# Patient Record
Sex: Female | Born: 1960 | Race: Black or African American | Hispanic: No | Smoking: Never smoker
Health system: Southern US, Community
[De-identification: ages and names within clinical notes are randomized; demographics above are authoritative.]

## PROBLEM LIST (undated history)

## (undated) ENCOUNTER — Ambulatory Visit (HOSPITAL_COMMUNITY): Admission: EM | Payer: 59 | Source: Home / Self Care

## (undated) ENCOUNTER — Ambulatory Visit (HOSPITAL_COMMUNITY): Admission: EM | Payer: 59

## (undated) DIAGNOSIS — T7840XA Allergy, unspecified, initial encounter: Secondary | ICD-10-CM

## (undated) DIAGNOSIS — I10 Essential (primary) hypertension: Secondary | ICD-10-CM

## (undated) DIAGNOSIS — E119 Type 2 diabetes mellitus without complications: Secondary | ICD-10-CM

## (undated) HISTORY — PX: CHOLECYSTECTOMY: SHX55

## (undated) HISTORY — DX: Allergy, unspecified, initial encounter: T78.40XA

## (undated) HISTORY — DX: Type 2 diabetes mellitus without complications: E11.9

## (undated) HISTORY — DX: Essential (primary) hypertension: I10

## (undated) HISTORY — PX: DENTAL SURGERY: SHX609

---

## 2001-05-21 ENCOUNTER — Encounter: Payer: Self-pay | Admitting: Emergency Medicine

## 2001-05-21 ENCOUNTER — Emergency Department (HOSPITAL_COMMUNITY): Admission: EM | Admit: 2001-05-21 | Discharge: 2001-05-21 | Payer: Self-pay | Admitting: Emergency Medicine

## 2001-09-08 ENCOUNTER — Emergency Department (HOSPITAL_COMMUNITY): Admission: EM | Admit: 2001-09-08 | Discharge: 2001-09-08 | Payer: Self-pay | Admitting: Emergency Medicine

## 2001-09-08 ENCOUNTER — Inpatient Hospital Stay (HOSPITAL_COMMUNITY): Admission: EM | Admit: 2001-09-08 | Discharge: 2001-09-09 | Payer: Self-pay | Admitting: *Deleted

## 2001-09-08 ENCOUNTER — Encounter: Payer: Self-pay | Admitting: Emergency Medicine

## 2001-10-09 ENCOUNTER — Ambulatory Visit (HOSPITAL_COMMUNITY): Admission: RE | Admit: 2001-10-09 | Discharge: 2001-10-09 | Payer: Self-pay | Admitting: Pediatrics

## 2002-09-20 ENCOUNTER — Other Ambulatory Visit: Admission: RE | Admit: 2002-09-20 | Discharge: 2002-09-20 | Payer: Self-pay | Admitting: Family Medicine

## 2002-10-12 ENCOUNTER — Ambulatory Visit (HOSPITAL_COMMUNITY): Admission: RE | Admit: 2002-10-12 | Discharge: 2002-10-12 | Payer: Self-pay | Admitting: Family Medicine

## 2002-10-12 ENCOUNTER — Encounter: Payer: Self-pay | Admitting: Family Medicine

## 2003-07-05 ENCOUNTER — Ambulatory Visit (HOSPITAL_BASED_OUTPATIENT_CLINIC_OR_DEPARTMENT_OTHER): Admission: RE | Admit: 2003-07-05 | Discharge: 2003-07-05 | Payer: Self-pay | Admitting: Family Medicine

## 2004-05-13 ENCOUNTER — Encounter: Admission: RE | Admit: 2004-05-13 | Discharge: 2004-05-13 | Payer: Self-pay | Admitting: Family Medicine

## 2005-09-07 ENCOUNTER — Other Ambulatory Visit: Admission: RE | Admit: 2005-09-07 | Discharge: 2005-09-07 | Payer: Self-pay | Admitting: Family Medicine

## 2005-10-24 ENCOUNTER — Emergency Department (HOSPITAL_COMMUNITY): Admission: EM | Admit: 2005-10-24 | Discharge: 2005-10-25 | Payer: Self-pay | Admitting: Emergency Medicine

## 2006-01-01 ENCOUNTER — Emergency Department (HOSPITAL_COMMUNITY): Admission: EM | Admit: 2006-01-01 | Discharge: 2006-01-01 | Payer: Self-pay | Admitting: Emergency Medicine

## 2006-09-04 ENCOUNTER — Emergency Department (HOSPITAL_COMMUNITY): Admission: EM | Admit: 2006-09-04 | Discharge: 2006-09-04 | Payer: Self-pay | Admitting: Emergency Medicine

## 2008-09-23 ENCOUNTER — Encounter: Payer: Self-pay | Admitting: Family Medicine

## 2008-09-23 ENCOUNTER — Ambulatory Visit (HOSPITAL_COMMUNITY): Admission: RE | Admit: 2008-09-23 | Discharge: 2008-09-23 | Payer: Self-pay | Admitting: Family Medicine

## 2008-10-09 ENCOUNTER — Other Ambulatory Visit: Admission: RE | Admit: 2008-10-09 | Discharge: 2008-10-09 | Payer: Self-pay | Admitting: Family Medicine

## 2009-04-04 ENCOUNTER — Emergency Department (HOSPITAL_COMMUNITY): Admission: EM | Admit: 2009-04-04 | Discharge: 2009-04-04 | Payer: Self-pay | Admitting: Family Medicine

## 2009-04-15 ENCOUNTER — Encounter (INDEPENDENT_AMBULATORY_CARE_PROVIDER_SITE_OTHER): Payer: Self-pay | Admitting: Internal Medicine

## 2009-04-15 ENCOUNTER — Ambulatory Visit: Payer: Self-pay | Admitting: Vascular Surgery

## 2009-12-11 ENCOUNTER — Inpatient Hospital Stay (HOSPITAL_COMMUNITY): Admission: EM | Admit: 2009-12-11 | Discharge: 2009-04-17 | Payer: Self-pay | Admitting: Emergency Medicine

## 2010-03-25 LAB — POCT CARDIAC MARKERS
CKMB, poc: 3.6 ng/mL (ref 1.0–8.0)
Myoglobin, poc: 373 ng/mL (ref 12–200)
Troponin i, poc: <0.05 ng/mL (ref 0.00–0.09)
Troponin i, poc: <0.05 ng/mL (ref 0.00–0.09)

## 2010-03-25 LAB — URINALYSIS, ROUTINE W REFLEX MICROSCOPIC
Bilirubin Urine: NEGATIVE
Hgb urine dipstick: NEGATIVE
Nitrite: NEGATIVE
Protein, ur: NEGATIVE mg/dL
Specific Gravity, Urine: 1.014 (ref 1.005–1.030)
Urobilinogen, UA: 1 mg/dL (ref 0.0–1.0)

## 2010-03-25 LAB — CBC
Hemoglobin: 11.4 g/dL — ABNORMAL LOW (ref 12.0–15.0)
Hemoglobin: 11.6 g/dL — ABNORMAL LOW (ref 12.0–15.0)
Hemoglobin: 12.5 g/dL (ref 12.0–15.0)
MCHC: 32.8 g/dL (ref 30.0–36.0)
MCV: 83 fL (ref 78.0–100.0)
MCV: 83.4 fL (ref 78.0–100.0)
MCV: 83.5 fL (ref 78.0–100.0)
Platelets: 213 10*3/uL (ref 150–400)
RBC: 4.6 MIL/uL (ref 3.87–5.11)
RDW: 13.1 % (ref 11.5–15.5)
RDW: 13.4 % (ref 11.5–15.5)
WBC: 9.6 10*3/uL (ref 4.0–10.5)

## 2010-03-25 LAB — DIFFERENTIAL
Basophils Absolute: 0 10*3/uL (ref 0.0–0.1)
Basophils Relative: 0 % (ref 0–1)
Basophils Relative: 0 % (ref 0–1)
Eosinophils Absolute: 0.2 10*3/uL (ref 0.0–0.7)
Eosinophils Relative: 1 % (ref 0–5)
Eosinophils Relative: 2 % (ref 0–5)
Lymphocytes Relative: 20 % (ref 12–46)
Monocytes Relative: 5 % (ref 3–12)
Neutro Abs: 4.5 10*3/uL (ref 1.7–7.7)
Neutrophils Relative %: 62 % (ref 43–77)

## 2010-03-25 LAB — TROPONIN I: Troponin I: 0.01 ng/mL (ref 0.00–0.06)

## 2010-03-25 LAB — COMPREHENSIVE METABOLIC PANEL
ALT: 48 U/L — ABNORMAL HIGH (ref 0–35)
AST: 41 U/L — ABNORMAL HIGH (ref 0–37)
Albumin: 3.7 g/dL (ref 3.5–5.2)
Alkaline Phosphatase: 100 U/L (ref 39–117)
Alkaline Phosphatase: 85 U/L (ref 39–117)
BUN: 11 mg/dL (ref 6–23)
CO2: 28 mEq/L (ref 19–32)
Calcium: 8.8 mg/dL (ref 8.4–10.5)
Chloride: 106 mEq/L (ref 96–112)
GFR calc Af Amer: >60 mL/min (ref >60)
GFR calc non Af Amer: >60 mL/min (ref >60)
Glucose, Bld: 168 mg/dL — ABNORMAL HIGH (ref 70–99)
Potassium: 4.3 mEq/L (ref 3.5–5.1)
Total Bilirubin: 0.6 mg/dL (ref 0.3–1.2)
Total Protein: 7.6 g/dL (ref 6.0–8.3)

## 2010-03-25 LAB — CARDIAC PANEL(CRET KIN+CKTOT+MB+TROPI)
CK, MB: 3.7 ng/mL (ref 0.3–4.0)
CK, MB: 4.3 ng/mL — ABNORMAL HIGH (ref 0.3–4.0)
Relative Index: 0.7 (ref 0.0–2.5)
Total CK: 569 U/L — ABNORMAL HIGH (ref 7–177)
Total CK: 612 U/L — ABNORMAL HIGH (ref 7–177)
Troponin I: 0.02 ng/mL (ref 0.00–0.06)
Troponin I: <0.01 ng/mL (ref 0.00–0.06)

## 2010-03-25 LAB — BASIC METABOLIC PANEL
CO2: 26 mEq/L (ref 19–32)
Calcium: 8.6 mg/dL (ref 8.4–10.5)
Creatinine, Ser: 0.82 mg/dL (ref 0.4–1.2)
Glucose, Bld: 92 mg/dL (ref 70–99)
Sodium: 140 mEq/L (ref 135–145)

## 2010-03-25 LAB — CK TOTAL AND CKMB (NOT AT ARMC)
CK, MB: 4.7 ng/mL — ABNORMAL HIGH (ref 0.3–4.0)
Relative Index: 0.7 (ref 0.0–2.5)

## 2010-03-25 LAB — ETHANOL: Alcohol, Ethyl (B): <5 mg/dL (ref 0–10)

## 2010-03-25 LAB — LIPID PANEL
Cholesterol: 131 mg/dL (ref 0–200)
HDL: 37 mg/dL — ABNORMAL LOW (ref >39)
LDL Cholesterol: 81 mg/dL (ref 0–99)

## 2010-03-25 LAB — PREGNANCY, URINE: Preg Test, Ur: NEGATIVE

## 2010-03-25 LAB — PROTIME-INR: Prothrombin Time: 13.4 seconds (ref 11.6–15.2)

## 2010-05-22 NOTE — Discharge Summary (Signed)
   NAME:  Colleen Rowe, Colleen Rowe                       ACCOUNT NO.:  [id number]   MEDICAL RECORD NO.:  [id number]                   PATIENT TYPE:  INP   LOCATION:  2012                                 FACILITY:  [facility]   PHYSICIAN:  Sherral Hammers, M.D.               DATE OF BIRTH:  [birth year]   DATE OF ADMISSION:  09/08/2001  DATE OF DISCHARGE:  09/09/2001                                 DISCHARGE SUMMARY   DISCHARGE DIAGNOSES:  1. Chest pain, noncardiac in origin.  2. Elevated liver function tests of undetermined etiology.   HOSPITAL COURSE:  The patient is a 50 year old African-American female who  presented to [facility] with chest pain consistent with  angina.  Enzymes were obtained. Her enzymes were abnormal, but she was  transferred to [facility] for further evaluation.  CKs at [facility] were 899 with an MB of 6.3 and troponin of 0.01.  The  patient was seen by Dr. Domingo Sep.  She was continued on heparin.  She ruled  out for an MI.  Her CKs remained elevated, but her troponin was negative.  She was discharged on September 09, 2001, and will follow up with office with  Dr. Domingo Sep early next week.   DISCHARGE MEDICATIONS:  Over-the-counter Zantac p.r.n.   DISCHARGE LABORATORY DATA:  White count 8.6, hemoglobin 11.0, hematocrit  33.1, platelets 232, INR 1.0.  Sodium 136, potassium 3.6, BUN 11, creatinine  1.0.  AST is 46, ALT 61.  CK 766, troponin 0.02.  Lipid profile shows an LDL  of 46, HDL of 43, triglycerides 67.  TSH 1.079.  Urinalysis does show a  moderate amount of myoglobin.  Urine culture was unremarkable.   DISPOSITION:  The patient was discharged in stable condition and will follow  up in the office with Dr. Domingo Sep next week.     Abelino Derrick, P.A.                      Sherral Hammers, M.D.    Lenard Lance  D:  10/06/2001  T:  10/10/2001  Job:  [id number]

## 2010-05-22 NOTE — Cardiovascular Report (Signed)
NAME:  Colleen Rowe, Colleen Rowe                       ACCOUNT NO.:  [id number]   MEDICAL RECORD NO.:  [id number]                   PATIENT TYPE:  OIB   LOCATION:  2857                                 FACILITY:  [facility]   PHYSICIAN:  Cristy Hilts. Jacinto Halim, M.D.                  DATE OF BIRTH:  July 12, 1960   DATE OF PROCEDURE:  10/09/2001  DATE OF DISCHARGE:  10/09/2001                              CARDIAC CATHETERIZATION   PROCEDURES PERFORMED:  1. Left ventriculography.  2. Selective right and left coronary arteriography.  3. Aortic root injection.  4. Selective bilateral renal arteriography.  5. Right femoral angiography with closure of right femoral artery access     with Perclose.   INDICATION:  The patient is a 50 year old female with a history of chest  pain, hypertension who had undergone nuclear stress test, which revealed  anterior wall thinning probably secondary to breast attenuation, with mildly  depressed EF of 46% of recent. However, she continued to have recurrent  chest pain.  Given this, she was brought to cardiac catheterization  laboratory to evaluate her coronary anatomy.  Aortic root injection was  performed because of normal coronaries to evaluate for evidence of aortic  insufficiency with hypertension given her chest pain and coronary ischemia.  Bilateral selective renal arteriography was performed to evaluate for  renovascular hypertension as her blood pressure was 211/109 mmHg.   HEMODYNAMIC DATA:  The left ventricular pressures 149/10 with an end-  diastolic pressure of 18 mmHg. The aortic pressure were 146/82 with a mean  of 110 mmHg.  There was no pressure gradient across the aortic valve.   ANGIOGRAPHIC DATA:   LEFT VENTRICULOGRAM:  The left ventricular systolic function was normal. EF  was estimated at 60%.  There was no significant mitral regurgitation.   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  There is no evidence of aortic  regurgitation nor evidence of aortic  dissection.   Right coronary artery:  The right coronary artery is a large caliber vessel  and a dominant vessel. It is normal.   Left main coronary artery:  The left main coronary artery is a large caliber  vessel and is normal.   Left circumflex:  Left circumflex is a large caliber vessel.  It gives  origin to a small obtuse marginal #1.  It is normal.   Left anterior descending artery:  Left anterior descending artery is a large  caliber vessel.  It wraps around the apex.  It is normal. It gives origin to  a very large anterolateral one branch (diagonal #1).  This is normal.   SELECTIVE RENAL ARTERIOGRAPHY:  Selective renal arteriography revealed  presence of single renal arteries on either side. There is one to the left  and one to the right.  They were normal.    IMPRESSION:  1. Normal coronary arteries with normal left ventricular systolic  function,     mildly elevated left ventricle end-diastolic pressure, probably secondary     to hypertension with hypertensive heart disease.  2. Normal renal arteries, normal aortic root injection. No evidence of     aortic regurgitation.  3. Chest pain, probably of noncardiac etiology.   RECOMMENDATIONS:  Evaluation for noncardiac cause of chest pain is  indicated.   TECHNIQUE OF PROCEDURE:  Under the usual sterile precautions, using a 6  French right femoral artery access and a 6 [place] multipurpose B2 catheter  was advanced into the ascending aorta over a 0.035 inch J wire. The catheter  was gently advanced in the left ventricle and left ventricular pressures  were monitored.  Hand contrast injection of the left ventricle was  performed, both in LAO and RAO projection. Then the catheter was flushed  with saline, pulled back into the ascending aorta and pressure gradient  across gradient across the aortic valve was monitored. The right coronary  artery was selectively engaged and angiography was  performed.  In a similar  fashion the left main coronary artery was selectively engaged and  angiography was performed.  Then the catheter was pulled back into the  aortic root and aortic root injection was performed in the LAO projection.  The catheter was pulled back into the abdominal aorta and selective right  and left renal arteriography was performed. Then the catheter was pulled out  of the body.  Right femoral angiography was performed through the arterial  access sheath and access was closed with Perclose with excellent hemostasis  obtained.  The patient was transferred to recovery in a stable condition.                                                 Cristy Hilts. Jacinto Halim, M.D.    Pilar Plate  D:  10/09/2001  T:  10/11/2001  Job:  [id number]   cc:   Dani Gobble, MD   Bernerd Limbo. Leona Carry, M.D.

## 2013-05-03 ENCOUNTER — Encounter (HOSPITAL_COMMUNITY): Payer: Self-pay | Admitting: Emergency Medicine

## 2013-05-03 ENCOUNTER — Emergency Department (INDEPENDENT_AMBULATORY_CARE_PROVIDER_SITE_OTHER)
Admission: EM | Admit: 2013-05-03 | Discharge: 2013-05-03 | Disposition: A | Discharge status: Home / Self Care | Payer: PRIVATE HEALTH INSURANCE | Source: Home / Self Care | Attending: Emergency Medicine | Admitting: Emergency Medicine

## 2013-05-03 ENCOUNTER — Emergency Department (INDEPENDENT_AMBULATORY_CARE_PROVIDER_SITE_OTHER): Payer: PRIVATE HEALTH INSURANCE

## 2013-05-03 DIAGNOSIS — J209 Acute bronchitis, unspecified: Secondary | ICD-10-CM

## 2013-05-03 DIAGNOSIS — J019 Acute sinusitis, unspecified: Secondary | ICD-10-CM

## 2013-05-03 LAB — POCT RAPID STREP A: STREPTOCOCCUS, GROUP A SCREEN (DIRECT): NEGATIVE

## 2013-05-03 MED ORDER — ALBUTEROL SULFATE HFA 108 (90 BASE) MCG/ACT IN AERS: 2.0000 | INHALATION_SPRAY | Freq: Four times a day (QID) | RESPIRATORY_TRACT | Status: DC

## 2013-05-03 MED ORDER — AZITHROMYCIN 250 MG PO TABS: ORAL_TABLET | ORAL | Status: DC

## 2013-05-03 MED ORDER — GUAIFENESIN-CODEINE 100-10 MG/5ML PO SYRP: 10.0000 mL | ORAL_SOLUTION | Freq: Four times a day (QID) | ORAL | Status: DC | PRN

## 2013-05-03 MED ORDER — PREDNISONE 20 MG PO TABS: 20.0000 mg | ORAL_TABLET | Freq: Two times a day (BID) | ORAL | Status: DC

## 2013-05-03 NOTE — ED Provider Notes (Signed)
Chief Complaint   Chief Complaint  Patient presents with  . URI    History of Present Illness   Colleen Rowe is a 53 year old female who has had a three-week history of headache, sore throat, cough productive of red sputum, chest tightness, wheezing, nasal congestion with clear drainage, headache, ear congestion, eye redness, sweats, nausea, and vomiting. She denies any chest pain, fever, diarrhea, or abdominal pain. She's had no sick exposures, but does work as a Electrical engineersecurity guard at [employer]. No history of asthma or cigarette smoking.  Review of Systems   Other than as noted above, the patient denies any of the following symptoms: Systemic:  No fevers, chills, sweats, or myalgias. Eye:  No redness or discharge. ENT:  No ear pain, headache, nasal congestion, drainage, sinus pressure, or sore throat. Neck:  No neck pain, stiffness, or swollen glands. Lungs:  No cough, sputum production, hemoptysis, wheezing, chest tightness, shortness of breath or chest pain. GI:  No abdominal pain, nausea, vomiting or diarrhea.  PMFSH   Past medical history, family history, social history, meds, and allergies were reviewed.   Physical exam   Vital signs:  BP 156/90  Pulse 88  Temp(Src) 98 F (36.7 C) (Oral)  Resp 16  SpO2 98% General:  Alert and oriented.  In no distress.  Skin warm and dry. Eye:  No conjunctival injection or drainage. Lids were normal. ENT:  TMs and canals were normal, without erythema or inflammation.  Nasal mucosa was clear and uncongested, without drainage.  Mucous membranes were moist.  Pharynx was clear with no exudate or drainage.  There were no oral ulcerations or lesions. Neck:  Supple, no adenopathy, tenderness or mass. Lungs:  No respiratory distress.  Lungs were clear to auscultation, without wheezes, rales or rhonchi.  Breath sounds were clear and equal bilaterally.  Heart:  Regular rhythm, without gallops, murmers or rubs. Skin:  Clear, warm, and dry,  without rash or lesions.  Labs   Results for orders placed during the hospital encounter of 05/03/13  POCT RAPID STREP A ([facility] CARE ONLY)      Result Value Ref Range   Streptococcus, Group A Screen (Direct) NEGATIVE  NEGATIVE     Radiology   Dg Chest 2 View  05/03/2013   CLINICAL DATA:  productive cough for 3 weeks,  hemoptysis  EXAM: CHEST  2 VIEW  COMPARISON:  DG CHEST 1V PORT dated 04/14/2009  FINDINGS: The heart size and mediastinal contours are within normal limits. Both lungs are clear. The visualized skeletal structures are unremarkable.  IMPRESSION: No active cardiopulmonary disease.   Electronically Signed   By: Salome HolmesHector  Cooper M.D.   On: 05/03/2013 19:42   Assessment     The primary encounter diagnosis was Acute bronchitis. A diagnosis of Acute sinusitis was also pertinent to this visit.  Plan    1.  Meds:  The following meds were prescribed:   New Prescriptions   ALBUTEROL (PROVENTIL HFA;VENTOLIN HFA) 108 (90 BASE) MCG/ACT INHALER    Inhale 2 puffs into the lungs 4 (four) times daily.   AZITHROMYCIN (ZITHROMAX Z-PAK) 250 MG TABLET    Take as directed.   GUAIFENESIN-CODEINE (GUIATUSS AC) 100-10 MG/5ML SYRUP    Take 10 mLs by mouth 4 (four) times daily as needed for cough.   PREDNISONE (DELTASONE) 20 MG TABLET    Take 1 tablet (20 mg total) by mouth 2 (two) times daily.    2.  Patient Education/Counseling:  The patient was  given appropriate handouts, self care instructions, and instructed in symptomatic relief.  Instructed to get extra fluids, rest, and use a cool mist vaporizer.    3.  Follow up:  The patient was told to follow up here if no better in 3 to 4 days, or sooner if becoming worse in any way, and given some red flag symptoms such as increasing fever, difficulty breathing, chest pain, or persistent vomiting which would prompt immediate return.  Follow up here as needed.      Reuben Likesavid C Morgen Linebaugh, MD 05/03/13 [phone]

## 2013-05-03 NOTE — Discharge Instructions (Signed)
How to Use an Inhaler °Proper inhaler technique is very important. Good technique ensures that the medicine reaches the lungs. Poor technique results in depositing the medicine on the tongue and back of the throat rather than in the airways. If you do not use the inhaler with good technique, the medicine will not help you. °STEPS TO FOLLOW IF USING AN INHALER WITHOUT AN EXTENSION TUBE °1. Remove the cap from the inhaler. °2. If you are using the inhaler for the first time, you will need to prime it. Shake the inhaler for 5 seconds and release four puffs into the air, away from your face. Ask your health care provider or pharmacist if you have questions about priming your inhaler. °3. Shake the inhaler for 5 seconds before each breath in (inhalation). °4. Position the inhaler so that the top of the canister faces up. °5. Put your index finger on the top of the medicine canister. Your thumb supports the bottom of the inhaler. °6. Open your mouth. °7. Either place the inhaler between your teeth and place your lips tightly around the mouthpiece, or hold the inhaler 1 2 inches away from your open mouth. If you are unsure of which technique to use, ask your health care provider. °8. Breathe out (exhale) normally and as completely as possible. °9. Press the canister down with your index finger to release the medicine. °10. At the same time as the canister is pressed, inhale deeply and slowly until your lungs are completely filled. This should take 4 6 seconds. Keep your tongue down. °11. Hold the medicine in your lungs for 5 10 seconds (10 seconds is best). This helps the medicine get into the small airways of your lungs. °12. Breathe out slowly, through pursed lips. Whistling is an example of pursed lips. °13. Wait at least 15 30 seconds between puffs. Continue with the above steps until you have taken the number of puffs your health care provider has ordered. Do not use the inhaler more than your health care provider  tells you. °14. Replace the cap on the inhaler. °15. Follow the directions from your health care provider or the inhaler insert for cleaning the inhaler. °STEPS TO FOLLOW IF USING AN INHALER WITH AN EXTENSION (SPACER) °1. Remove the cap from the inhaler. °2. If you are using the inhaler for the first time, you will need to prime it. Shake the inhaler for 5 seconds and release four puffs into the air, away from your face. Ask your health care provider or pharmacist if you have questions about priming your inhaler. °3. Shake the inhaler for 5 seconds before each breath in (inhalation). °4. Place the open end of the spacer onto the mouthpiece of the inhaler. °5. Position the inhaler so that the top of the canister faces up and the spacer mouthpiece faces you. °6. Put your index finger on the top of the medicine canister. Your thumb supports the bottom of the inhaler and the spacer. °7. Breathe out (exhale) normally and as completely as possible. °8. Immediately after exhaling, place the spacer between your teeth and into your mouth. Close your lips tightly around the spacer. °9. Press the canister down with your index finger to release the medicine. °10. At the same time as the canister is pressed, inhale deeply and slowly until your lungs are completely filled. This should take 4 6 seconds. Keep your tongue down and out of the way. °11. Hold the medicine in your lungs for 5 10 seconds (10   seconds is best). This helps the medicine get into the small airways of your lungs. Exhale. °12. Repeat inhaling deeply through the spacer mouthpiece. Again hold that breath for up to 10 seconds (10 seconds is best). Exhale slowly. If it is difficult to take this second deep breath through the spacer, breathe normally several times through the spacer. Remove the spacer from your mouth. °13. Wait at least 15 30 seconds between puffs. Continue with the above steps until you have taken the number of puffs your health care provider has  ordered. Do not use the inhaler more than your health care provider tells you. °14. Remove the spacer from the inhaler, and place the cap on the inhaler. °15. Follow the directions from your health care provider or the inhaler insert for cleaning the inhaler and spacer. °If you are using different kinds of inhalers, use your quick relief medicine to open the airways 10 15 minutes before using a steroid if instructed to do so by your health care provider. If you are unsure which inhalers to use and the order of using them, ask your health care provider, nurse, or respiratory therapist. °If you are using a steroid inhaler, always rinse your mouth with water after your last puff, then gargle and spit out the water. Do not swallow the water. °AVOID: °· Inhaling before or after starting the spray of medicine. It takes practice to coordinate your breathing with triggering the spray. °· Inhaling through the nose (rather than the mouth) when triggering the spray. °HOW TO DETERMINE IF YOUR INHALER IS FULL OR NEARLY EMPTY °You cannot know when an inhaler is empty by shaking it. A few inhalers are now being made with dose counters. Ask your health care provider for a prescription that has a dose counter if you feel you need that extra help. If your inhaler does not have a counter, ask your health care provider to help you determine the date you need to refill your inhaler. Write the refill date on a calendar or your inhaler canister. Refill your inhaler 7 10 days before it runs out. Be sure to keep an adequate supply of medicine. This includes making sure it is not expired, and that you have a spare inhaler.  °SEEK MEDICAL CARE IF:  °· Your symptoms are only partially relieved with your inhaler. °· You are having trouble using your inhaler. °· You have some increase in phlegm. °SEEK IMMEDIATE MEDICAL CARE IF:  °· You feel little or no relief with your inhalers. You are still wheezing and are feeling shortness of breath or  tightness in your chest or both. °· You have dizziness, headaches, or a fast heart rate. °· You have chills, fever, or night sweats. °· You have a noticeable increase in phlegm production, or there is blood in the phlegm. °MAKE SURE YOU:  °· Understand these instructions. °· Will watch your condition. °· Will get help right away if you are not doing well or get worse. °Document Released: 12/19/1999 Document Revised: 10/11/2012 Document Reviewed: 07/20/2012 °[employer]® Patient Information ©2014 [employer]. ° °Most upper respiratory infections are caused by viruses and do not require antibiotics.  We try to save the antibiotics for when we really need them to prevent bacteria from developing resistance to them.  Here are a few hints about things that can be done at home to help get over an upper respiratory infection quicker: ° °Get extra sleep and extra fluids.  Get 7 to 9 hours of sleep per   night and 6 to 8 glasses of water a day.  Getting extra sleep keeps the immune system from getting run down.  Most people with an upper respiratory infection are a little dehydrated.  The extra fluids also keep the secretions liquified and easier to deal with.  Also, get extra vitamin C.  4000 mg per day is the recommended dose. °For the aches, headache, and fever, acetaminophen or ibuprofen are helpful.  These can be alternated every 4 hours.  People with liver disease should avoid large amounts of acetaminophen, and people with ulcer disease, gastroesophageal reflux, gastritis, congestive heart failure, chronic kidney disease, coronary artery disease and the elderly should avoid ibuprofen. °For nasal congestion try Mucinex-D, or if you're having lots of sneezing or clear nasal drainage use Zyrtec-D. People with high blood pressure can take these if their blood pressure is controlled, if not, it's best to avoid the forms with a "D" (decongestants).  You can use the plain Mucinex, Allegra, Claritin, or Zyrtec even if your blood  pressure is not controlled.   °A Saline nasal spray such as Ocean Spray can also help.  You can add a decongestant sprays such as Afrin, but you should not use the decongestant sprays for more than 3 or 4 days since they can be habituating.  Breathe Rite nasal strips can also offer a non-drug alternative treatment to nasal congestion, especially at night. °For people with symptoms of sinusitis, sleeping with your head elevated can be helpful.  For sinus pain, moist, hot compresses to the face may provide some relief.  Many people find that inhaling steam as in a shower or from a pot of steaming water can help. °For any viral infection, zinc containing lozenges such as Cold-Eze or Zicam are helpful.  Zinc helps to fight viral infection.  Hot salt water gargles (8 oz of hot water, 1/2 tsp of table salt, and a pinch of baking soda) can give relief as well as hot beverages such as hot tea.  Sucrets extra strength lozenges will help the sore throat.  °For the cough, take Delsym 2 tsp every 12 hours.  It has also been found recently that Aleve can help control a cough.  The dose is 1 to 2 tablets twice daily with food.  This can be combined with Delsym. (Note, if you are taking ibuprofen, you should not take Aleve as well--take one or the other.) °A cool mist vaporizer will help keep your mucous membranes from drying out.  ° °It's important when you have an upper respiratory infection not to pass the infection to others.  This involves being very careful about the following: ° °Frequent hand washing or use of hand sanitizer, especially after coughing, sneezing, blowing your nose or touching your face, nose or eyes. °Do not shake hands or touch anyone and try to avoid touching surfaces that other people use such as doorknobs, shopping carts, telephones and computer keyboards. °Use tissues and dispose of them properly in a garbage can or ziplock bag. °Cough into your sleeve. °Do not let others eat or drink after  you. ° °It's also important to recognize the signs of serious illness and get evaluated if they occur: °Any respiratory infection that lasts more than 7 to 10 days.  Yellow nasal drainage and sputum are not reliable indicators of a bacterial infection, but if they last for more than 1 week, see your doctor. °Fever and sore throat can indicate strep. °Fever and cough can indicate influenza or pneumonia. °  Any kind of severe symptom such as difficulty breathing, intractable vomiting, or severe pain should prompt you to see a doctor as soon as possible. ° ° °Your body's immune system is really the thing that will get rid of this infection.  Your immune system is comprised of 2 types of specialized cells called T cells and B cells.  T cells coordinate the array of cells in your body that engulf invading bacteria or viruses while B cells orchestrate the production of antibodies that neutralize infection.  Anything we do or any medications we give you, will just strengthen your immune system or help it clear up the infection quicker.  Here are a few helpful hints to improve your immune system to help overcome this illness or to prevent future infections: °· A few vitamins can improve the health of your immune system.  That's why your diet should include plenty of fruits, vegetables, fish, nuts, and whole grains. °· Vitamin A and bet-carotene can increase the cells that fight infections (T cells and B cells).  Vitamin A is abundant in dark greens and orange vegetables such as spinach, greens, sweet potatoes, and carrots. °· Vitamin B6 contributes to the maturation of white blood cells, the cells that fight disease.  Foods with vitamin B6 include cold cereal and bananas. °· Vitamin C is credited with preventing colds because it increases white blood cells and also prevents cellular damage.  Citrus fruits, peaches and green and red bell peppers are all hight in vitamin C. °· Vitamin E is an anti-oxidant that encourages the  production of natural killer cells which reject foreign invaders and B cells that produce antibodies.  Foods high in vitamin E include wheat germ, nuts and seeds. °· Foods high in omega-3 fatty acids found in foods like salmon, tuna and mackerel boost your immune system and help cells to engulf and absorb germs. °· Probiotics are good bacteria that increase your T cells.  These can be found in yogurt and are available in supplements such as Culturelle or Align. °· Moderate exercise increases the strength of your immune system and your ability to recover from illness.  I suggest 3 to 5 moderate intensity 30 minute workouts per week.   °· Sleep is another component of maintaining a strong immune system.  It enables your body to recuperate from the day's activities, stress and work.  My recommendation is to get between 7 and 9 hours of sleep per night. °· If you smoke, try to quit completely or at least cut down.  Drink alcohol only in moderation if at all.  No more than 2 drinks daily for men or 1 for women. °· Get a flu vaccine early in the fall or if you have not gotten one yet, once this illness has run its course.  If you are over 65, a smoker, or an asthmatic, get a pneumococcal vaccine. °· My final recommendation is to maintain a healthy weight.  Excess weight can impair the immune system by interfering with the way the immune system deals with invading viruses or bacteria. ° ° ° °Bronchitis °Bronchitis is inflammation of the airways that extend from the windpipe into the lungs (bronchi). The inflammation often causes mucus to develop, which leads to a cough. If the inflammation becomes severe, it may cause shortness of breath. °CAUSES  °Bronchitis may be caused by:  °· Viral infections.   °· Bacteria.   °· Cigarette smoke.   °· Allergens, pollutants, and other irritants.   °SIGNS AND SYMPTOMS  °  The most common symptom of bronchitis is a frequent cough that produces mucus. Other symptoms include: °· Fever.    °· Body aches.   °· Chest congestion.   °· Chills.   °· Shortness of breath.   °· Sore throat.   °DIAGNOSIS  °Bronchitis is usually diagnosed through a medical history and physical exam. Tests, such as chest X-rays, are sometimes done to rule out other conditions.  °TREATMENT  °You may need to avoid contact with whatever caused the problem (smoking, for example). Medicines are sometimes needed. These may include: °· Antibiotics. These may be prescribed if the condition is caused by bacteria. °· Cough suppressants. These may be prescribed for relief of cough symptoms.   °· Inhaled medicines. These may be prescribed to help open your airways and make it easier for you to breathe.   °· Steroid medicines. These may be prescribed for those with recurrent (chronic) bronchitis. °HOME CARE INSTRUCTIONS °· Get plenty of rest.   °· Drink enough fluids to keep your urine clear or pale yellow (unless you have a medical condition that requires fluid restriction). Increasing fluids may help thin your secretions and will prevent dehydration.   °· Only take over-the-counter or prescription medicines as directed by your health care provider. °· Only take antibiotics as directed. Make sure you finish them even if you start to feel better. °· Avoid secondhand smoke, irritating chemicals, and strong fumes. These will make bronchitis worse. If you are a smoker, quit smoking. Consider using nicotine gum or skin patches to help control withdrawal symptoms. Quitting smoking will help your lungs heal faster.   °· Put a cool-mist humidifier in your bedroom at night to moisten the air. This may help loosen mucus. Change the water in the humidifier daily. You can also run the hot water in your shower and sit in the bathroom with the door closed for 5 10 minutes.   °· Follow up with your health care provider as directed.   °· Wash your hands frequently to avoid catching bronchitis again or spreading an infection to others.   °SEEK MEDICAL  CARE IF: °Your symptoms do not improve after 1 week of treatment.  °SEEK IMMEDIATE MEDICAL CARE IF: °· Your fever increases. °· You have chills.   °· You have chest pain.   °· You have worsening shortness of breath.   °· You have bloody sputum. °· You faint.   °· You have lightheadedness. °· You have a severe headache.   °· You vomit repeatedly. °MAKE SURE YOU:  °· Understand these instructions. °· Will watch your condition. °· Will get help right away if you are not doing well or get worse. °Document Released: 12/21/2004 Document Revised: 10/11/2012 Document Reviewed: 08/15/2012 °[employer]® Patient Information ©2014 [employer]. ° ° °

## 2013-05-03 NOTE — ED Notes (Signed)
C/o cold sx States she has a sore throat, headache, nausea, vomiting, sob, nasal congestion,cough otc sinus and cold med taking as tx

## 2013-05-07 LAB — CULTURE, GROUP A STREP

## 2014-03-28 ENCOUNTER — Emergency Department (HOSPITAL_COMMUNITY)
Admission: EM | Admit: 2014-03-28 | Discharge: 2014-03-28 | Disposition: A | Discharge status: Home / Self Care | Payer: PRIVATE HEALTH INSURANCE | Attending: Emergency Medicine | Admitting: Emergency Medicine

## 2014-03-28 ENCOUNTER — Emergency Department (HOSPITAL_COMMUNITY): Payer: PRIVATE HEALTH INSURANCE

## 2014-03-28 ENCOUNTER — Encounter (HOSPITAL_COMMUNITY): Payer: Self-pay | Admitting: *Deleted

## 2014-03-28 DIAGNOSIS — R079 Chest pain, unspecified: Secondary | ICD-10-CM

## 2014-03-28 DIAGNOSIS — Z79899 Other long term (current) drug therapy: Secondary | ICD-10-CM | POA: Insufficient documentation

## 2014-03-28 DIAGNOSIS — I10 Essential (primary) hypertension: Secondary | ICD-10-CM

## 2014-03-28 DIAGNOSIS — Z7982 Long term (current) use of aspirin: Secondary | ICD-10-CM | POA: Insufficient documentation

## 2014-03-28 LAB — CBC WITH DIFFERENTIAL/PLATELET
BASOS PCT: 0 % (ref 0–1)
Basophils Absolute: 0 10*3/uL (ref 0.0–0.1)
Eosinophils Absolute: 0.2 10*3/uL (ref 0.0–0.7)
Eosinophils Relative: 3 % (ref 0–5)
HCT: 38.5 % (ref 36.0–46.0)
HEMOGLOBIN: 12.4 g/dL (ref 12.0–15.0)
LYMPHS ABS: 2.6 10*3/uL (ref 0.7–4.0)
Lymphocytes Relative: 29 % (ref 12–46)
MCH: 26.8 pg (ref 26.0–34.0)
MCHC: 32.2 g/dL (ref 30.0–36.0)
MCV: 83.2 fL (ref 78.0–100.0)
MONOS PCT: 5 % (ref 3–12)
Monocytes Absolute: 0.5 10*3/uL (ref 0.1–1.0)
NEUTROS ABS: 5.7 10*3/uL (ref 1.7–7.7)
NEUTROS PCT: 63 % (ref 43–77)
PLATELETS: 240 10*3/uL (ref 150–400)
RBC: 4.63 MIL/uL (ref 3.87–5.11)
RDW: 13.2 % (ref 11.5–15.5)
WBC: 9 10*3/uL (ref 4.0–10.5)

## 2014-03-28 LAB — COMPREHENSIVE METABOLIC PANEL
ALT: 35 U/L (ref 0–35)
AST: 39 U/L — ABNORMAL HIGH (ref 0–37)
Albumin: 3.9 g/dL (ref 3.5–5.2)
Alkaline Phosphatase: 117 U/L (ref 39–117)
Anion gap: 9 (ref 5–15)
BUN: 12 mg/dL (ref 6–23)
CALCIUM: 8.8 mg/dL (ref 8.4–10.5)
CO2: 22 mmol/L (ref 19–32)
Chloride: 108 mmol/L (ref 96–112)
Creatinine, Ser: 0.93 mg/dL (ref 0.50–1.10)
GFR, EST AFRICAN AMERICAN: 79 mL/min — AB (ref >90)
GFR, EST NON AFRICAN AMERICAN: 68 mL/min — AB (ref >90)
GLUCOSE: 179 mg/dL — AB (ref 70–99)
Potassium: 3.4 mmol/L — ABNORMAL LOW (ref 3.5–5.1)
Sodium: 139 mmol/L (ref 135–145)
Total Bilirubin: 0.4 mg/dL (ref 0.3–1.2)
Total Protein: 7.5 g/dL (ref 6.0–8.3)

## 2014-03-28 LAB — TROPONIN I: Troponin I: <0.03 ng/mL (ref <0.031)

## 2014-03-28 LAB — D-DIMER, QUANTITATIVE (NOT AT ARMC): D DIMER QUANT: 0.39 ug{FEU}/mL (ref 0.00–0.48)

## 2014-03-28 MED ORDER — TRAMADOL HCL 50 MG PO TABS: 50.0000 mg | ORAL_TABLET | Freq: Four times a day (QID) | ORAL | Status: DC | PRN

## 2014-03-28 MED ORDER — LISINOPRIL 20 MG PO TABS: 20.0000 mg | ORAL_TABLET | Freq: Every day | ORAL | Status: DC

## 2014-03-28 MED ORDER — OXYCODONE-ACETAMINOPHEN 5-325 MG PO TABS
1.0000 | ORAL_TABLET | Freq: Once | ORAL | Status: AC
  Administered 2014-03-28: 1 via ORAL
  Filled 2014-03-28: qty 1

## 2014-03-28 NOTE — ED Notes (Addendum)
Pt co left sided chest pain x 3 days, worse on inspiration, pt states pain is a constant throbbing and it radiates down the left arm, pt states has had similar episode with stress in the past and is having a lot of stress now as well. Pt took 325mg  asa x2 before she left the house today. Pt reports nausea. Pt's BP is elevated at this time.

## 2014-03-28 NOTE — ED Notes (Signed)
Laughing with visitor, NAD

## 2014-03-28 NOTE — Discharge Instructions (Signed)
Follow up with dr. Parke SimmersBland in 1-2 weeks

## 2014-03-29 NOTE — ED Provider Notes (Signed)
CSN: [id number]     Arrival date & time 03/28/14  1642 History   First MD Initiated Contact with Patient 03/28/14 1648     Chief Complaint  Patient presents with  . Chest Pain     (Consider location/radiation/quality/duration/timing/severity/associated sxs/prior Treatment) Patient is a 54 y.o. female presenting with chest pain. The history is provided by the patient (the pt complains of chest tightness).  Chest Pain Pain location:  Substernal area Pain quality: aching   Pain radiates to:  Does not radiate Pain radiates to the back: no   Pain severity:  Mild Onset quality:  Gradual Timing:  Intermittent Chronicity:  New Context: not breathing   Associated symptoms: no abdominal pain, no back pain, no cough, no fatigue and no headache     History reviewed. No pertinent past medical history. History reviewed. No pertinent past surgical history. History reviewed. No pertinent family history. History  Substance Use Topics  . Smoking status: Never Smoker   . Smokeless tobacco: Not on file  . Alcohol Use: No   OB History    No data available     Review of Systems  Constitutional: Negative for appetite change and fatigue.  HENT: Negative for congestion, ear discharge and sinus pressure.   Eyes: Negative for discharge.  Respiratory: Negative for cough.   Cardiovascular: Positive for chest pain.  Gastrointestinal: Negative for abdominal pain and diarrhea.  Genitourinary: Negative for frequency and hematuria.  Musculoskeletal: Negative for back pain.  Skin: Negative for rash.  Neurological: Negative for seizures and headaches.  Psychiatric/Behavioral: Negative for hallucinations.      Allergies  Review of patient's allergies indicates no known allergies.  Home Medications   Prior to Admission medications   Medication Sig Start Date End Date Taking? Authorizing Provider  albuterol (PROVENTIL HFA;VENTOLIN HFA) 108 (90 BASE) MCG/ACT inhaler Inhale 2 puffs into the lungs  4 (four) times daily. 05/03/13  Yes Reuben Likes, MD  aspirin EC 81 MG tablet Take 81-162 mg by mouth daily as needed for mild pain or moderate pain.   Yes Historical Provider, MD  azithromycin (ZITHROMAX Z-PAK) 250 MG tablet Take as directed. Patient not taking: Reported on 03/28/2014 05/03/13   Reuben Likes, MD  guaiFENesin-codeine [facility]) 100-10 MG/5ML syrup Take 10 mLs by mouth 4 (four) times daily as needed for cough. Patient not taking: Reported on 03/28/2014 05/03/13   Reuben Likes, MD  lisinopril (PRINIVIL,ZESTRIL) 20 MG tablet Take 1 tablet (20 mg total) by mouth daily. 03/28/14   Bethann Berkshire, MD  predniSONE (DELTASONE) 20 MG tablet Take 1 tablet (20 mg total) by mouth 2 (two) times daily. Patient not taking: Reported on 03/28/2014 05/03/13   Reuben Likes, MD  traMADol (ULTRAM) 50 MG tablet Take 1 tablet (50 mg total) by mouth every 6 (six) hours as needed. 03/28/14   Bethann Berkshire, MD   BP 165/99 mmHg  Pulse 61  Temp(Src) 98 F (36.7 C) (Oral)  Resp 16  Ht  (1.702 m)  Wt 165 lb (74.844 kg)  BMI 25.84 kg/m2  SpO2 94% Physical Exam  Constitutional: She is oriented to person, place, and time. She appears well-developed.  HENT:  Head: Normocephalic.  Eyes: Conjunctivae and EOM are normal. No scleral icterus.  Neck: Neck supple. No thyromegaly present.  Cardiovascular: Normal rate and regular rhythm.  Exam reveals no gallop and no friction rub.   No murmur heard. Pulmonary/Chest: No stridor. She has no wheezes. She has no rales. She  exhibits no tenderness.  Abdominal: She exhibits no distension. There is no tenderness. There is no rebound.  Musculoskeletal: Normal range of motion. She exhibits no edema.  Lymphadenopathy:    She has no cervical adenopathy.  Neurological: She is oriented to person, place, and time. She exhibits normal muscle tone. Coordination normal.  Skin: No rash noted. No erythema.  Psychiatric: She has a normal mood and affect. Her behavior is  normal.    ED Course  Procedures (including critical care time) Labs Review Labs Reviewed  COMPREHENSIVE METABOLIC PANEL - Abnormal; Notable for the following:    Potassium 3.4 (*)    Glucose, Bld 179 (*)    AST 39 (*)    GFR calc non Af Amer 68 (*)    GFR calc Af Amer 79 (*)    All other components within normal limits  CBC WITH DIFFERENTIAL/PLATELET  TROPONIN I  D-DIMER, QUANTITATIVE  TROPONIN I    Imaging Review Dg Chest 2 View  03/28/2014   CLINICAL DATA:  Left-sided chest pain for 3 days.  EXAM: CHEST  2 VIEW  COMPARISON:  05/03/2013  FINDINGS: The heart size and mediastinal contours are within normal limits. Both lungs are clear. The visualized skeletal structures are unremarkable.  IMPRESSION: Normal exam.   Electronically Signed   By: Francene BoyersJames  Maxwell M.D.   On: 03/28/2014 18:12     EKG Interpretation   Date/Time:  Thursday March 28 2014 16:53:52 EDT Ventricular Rate:  67 PR Interval:  149 QRS Duration: 78 QT Interval:  419 QTC Calculation: 442 R Axis:   28 Text Interpretation:  Sinus rhythm Borderline T abnormalities, diffuse  leads Confirmed by Judiann Celia  MD, Ramondo Dietze ([id number]) on 03/29/2014 12:49:02 PM      MDM   Final diagnoses:  Essential hypertension  Chest pain at rest    htn , chest pain.  Symptoms improved with lower bp.  Pt given lisinopril and will follow up with pcp    Bethann BerkshireJoseph Nikki Rusnak, MD 03/29/14 1250

## 2015-03-09 ENCOUNTER — Emergency Department (HOSPITAL_COMMUNITY)
Admission: EM | Admit: 2015-03-09 | Discharge: 2015-03-09 | Disposition: A | Discharge status: Home / Self Care | Payer: Worker's Compensation | Attending: Emergency Medicine | Admitting: Emergency Medicine

## 2015-03-09 ENCOUNTER — Encounter (HOSPITAL_COMMUNITY): Payer: Self-pay | Admitting: Emergency Medicine

## 2015-03-09 ENCOUNTER — Emergency Department (HOSPITAL_COMMUNITY): Payer: Worker's Compensation

## 2015-03-09 DIAGNOSIS — Y9301 Activity, walking, marching and hiking: Secondary | ICD-10-CM | POA: Insufficient documentation

## 2015-03-09 DIAGNOSIS — X509XXA Other and unspecified overexertion or strenuous movements or postures, initial encounter: Secondary | ICD-10-CM | POA: Diagnosis not present

## 2015-03-09 DIAGNOSIS — Y990 Civilian activity done for income or pay: Secondary | ICD-10-CM | POA: Diagnosis not present

## 2015-03-09 DIAGNOSIS — S99911A Unspecified injury of right ankle, initial encounter: Secondary | ICD-10-CM | POA: Diagnosis present

## 2015-03-09 DIAGNOSIS — S93401A Sprain of unspecified ligament of right ankle, initial encounter: Secondary | ICD-10-CM | POA: Diagnosis not present

## 2015-03-09 DIAGNOSIS — Z79899 Other long term (current) drug therapy: Secondary | ICD-10-CM | POA: Diagnosis not present

## 2015-03-09 DIAGNOSIS — Y9289 Other specified places as the place of occurrence of the external cause: Secondary | ICD-10-CM | POA: Diagnosis not present

## 2015-03-09 NOTE — ED Notes (Signed)
Patient verbalized understanding of discharge instructions and denies any further needs or questions at this time. VS stable. Patient ambulatory with steady gait.  

## 2015-03-09 NOTE — Discharge Instructions (Signed)
Ankle Sprain  An ankle sprain is an injury to the strong, fibrous tissues (ligaments) that hold the bones of your ankle joint together.   CAUSES  An ankle sprain is usually caused by a fall or by twisting your ankle. Ankle sprains most commonly occur when you step on the outer edge of your foot, and your ankle turns inward. People who participate in sports are more prone to these types of injuries.   SYMPTOMS    Pain in your ankle. The pain may be present at rest or only when you are trying to stand or walk.   Swelling.   Bruising. Bruising may develop immediately or within 1 to 2 days after your injury.   Difficulty standing or walking, particularly when turning corners or changing directions.  DIAGNOSIS   Your caregiver will ask you details about your injury and perform a physical exam of your ankle to determine if you have an ankle sprain. During the physical exam, your caregiver will press on and apply pressure to specific areas of your foot and ankle. Your caregiver will try to move your ankle in certain ways. An X-ray exam may be done to be sure a bone was not broken or a ligament did not separate from one of the bones in your ankle (avulsion fracture).   TREATMENT   Certain types of braces can help stabilize your ankle. Your caregiver can make a recommendation for this. Your caregiver may recommend the use of medicine for pain. If your sprain is severe, your caregiver may refer you to a surgeon who helps to restore function to parts of your skeletal system (orthopedist) or a physical therapist.  HOME CARE INSTRUCTIONS    Apply ice to your injury for 1-2 days or as directed by your caregiver. Applying ice helps to reduce inflammation and pain.    Put ice in a plastic bag.    Place a towel between your skin and the bag.    Leave the ice on for 15-20 minutes at a time, every 2 hours while you are awake.   Only take over-the-counter or prescription medicines for pain, discomfort, or fever as directed by  your caregiver.   Elevate your injured ankle above the level of your heart as much as possible for 2-3 days.   If your caregiver recommends crutches, use them as instructed. Gradually put weight on the affected ankle. Continue to use crutches or a cane until you can walk without feeling pain in your ankle.   If you have a plaster splint, wear the splint as directed by your caregiver. Do not rest it on anything harder than a pillow for the first 24 hours. Do not put weight on it. Do not get it wet. You may take it off to take a shower or bath.   You may have been given an elastic bandage to wear around your ankle to provide support. If the elastic bandage is too tight (you have numbness or tingling in your foot or your foot becomes cold and blue), adjust the bandage to make it comfortable.   If you have an air splint, you may blow more air into it or let air out to make it more comfortable. You may take your splint off at night and before taking a shower or bath. Wiggle your toes in the splint several times per day to decrease swelling.  SEEK MEDICAL CARE IF:    You have rapidly increasing bruising or swelling.   Your toes feel   extremely cold or you lose feeling in your foot.   Your pain is not relieved with medicine.  SEEK IMMEDIATE MEDICAL CARE IF:   Your toes are numb or blue.   You have severe pain that is increasing.  MAKE SURE YOU:    Understand these instructions.   Will watch your condition.   Will get help right away if you are not doing well or get worse.     This information is not intended to replace advice given to you by your health care provider. Make sure you discuss any questions you have with your health care provider.     Document Released: 12/21/2004 Document Revised: 01/11/2014 Document Reviewed: 01/02/2011  Elsevier Interactive Patient Education 2016 [employer]

## 2015-03-09 NOTE — ED Notes (Signed)
Pt sts she twisted R ankle while at work around 7pm. Pt c.o pain and swelling to R lateral aspect of ankle and heel.

## 2015-03-09 NOTE — ED Provider Notes (Signed)
CSN: [id number]     Arrival date & time 03/09/15  0016 History  By signing my name below, I, Doreatha MartinEva Mathews, attest that this documentation has been prepared under the direction and in the presence of Benjiman CoreNathan Charne Mcbrien, MD. Electronically Signed: Doreatha MartinEva Mathews, ED Scribe. 03/09/2015. 1:32 AM.    Chief Complaint  Patient presents with  . Ankle Injury   The history is provided by the patient. No language interpreter was used.    HPI Comments: Colleen Rowe is a 55 y.o. female who presents to the Emergency Department complaining of moderate right ankle pain onset this evening s/p ankle inversion at work. Pt states she was walking down the stairs at work when her ankle rolled inward. She denies fall, LOC, head injury. She notes that her pain is worsened with movement, weight-bearing and ambulation. Pt denies taking OTC medications at home to improve symptoms. She denies numbness, additional injuries.   History reviewed. No pertinent past medical history. History reviewed. No pertinent past surgical history. No family history on file. Social History  Substance Use Topics  . Smoking status: Never Smoker   . Smokeless tobacco: None  . Alcohol Use: No   OB History    No data available     Review of Systems  Musculoskeletal: Positive for arthralgias.  Neurological: Negative for weakness and numbness.   Allergies  Review of patient's allergies indicates no known allergies.  Home Medications   Prior to Admission medications   Medication Sig Start Date End Date Taking? Authorizing Provider  albuterol (PROVENTIL HFA;VENTOLIN HFA) 108 (90 BASE) MCG/ACT inhaler Inhale 2 puffs into the lungs 4 (four) times daily. 05/03/13   Reuben Likesavid C Keller, MD  aspirin EC 81 MG tablet Take 81-162 mg by mouth daily as needed for mild pain or moderate pain.    Historical Provider, MD  azithromycin (ZITHROMAX Z-PAK) 250 MG tablet Take as directed. Patient not taking: Reported on 03/28/2014 05/03/13   Reuben Likesavid C Keller, MD   guaiFENesin-codeine [facility](GUIATUSS AC) 100-10 MG/5ML syrup Take 10 mLs by mouth 4 (four) times daily as needed for cough. Patient not taking: Reported on 03/28/2014 05/03/13   Reuben Likesavid C Keller, MD  lisinopril (PRINIVIL,ZESTRIL) 20 MG tablet Take 1 tablet (20 mg total) by mouth daily. 03/28/14   Bethann BerkshireJoseph Zammit, MD  predniSONE (DELTASONE) 20 MG tablet Take 1 tablet (20 mg total) by mouth 2 (two) times daily. Patient not taking: Reported on 03/28/2014 05/03/13   Reuben Likesavid C Keller, MD  traMADol (ULTRAM) 50 MG tablet Take 1 tablet (50 mg total) by mouth every 6 (six) hours as needed. 03/28/14   Bethann BerkshireJoseph Zammit, MD   BP 167/71 mmHg  Pulse 67  Temp(Src) 98 F (36.7 C) (Oral)  Resp 16  Ht 5\' 7"  (1.702 m)  Wt 165 lb (74.844 kg)  BMI 25.84 kg/m2  SpO2 100% Physical Exam  Constitutional: She appears well-developed and well-nourished.  HENT:  Head: Atraumatic.  Musculoskeletal: Normal range of motion. She exhibits tenderness.  Mild tenderness and slight swelling to anterior right malleolus. No posterior malleolus tenderness, no tenderness over the 5th metatarsal. Good pedal pulse. Sensation intact. NVI.   Neurological: She is alert.  Skin: Skin is warm.  Nursing note and vitals reviewed.   ED Course  Procedures (including critical care time) DIAGNOSTIC STUDIES: Oxygen Saturation is 96% on RA, adequate by my interpretation.    COORDINATION OF CARE: 1:30 AM Discussed treatment plan with pt at bedside which includes XR and pt agreed to plan.  Imaging Review Dg Ankle Complete Right  03/09/2015  CLINICAL DATA:  Tripped down several stairs at work, and twisted right ankle. Right lateral ankle swelling. Initial encounter. EXAM: RIGHT ANKLE - COMPLETE 3+ VIEW COMPARISON:  None. FINDINGS: There is no evidence of fracture or dislocation. The ankle mortise is intact; the interosseous space is within normal limits. No talar tilt or subluxation is seen. A small plantar calcaneal spur is noted. The joint spaces are  preserved. No significant soft tissue abnormalities are seen. IMPRESSION: No evidence of fracture or dislocation. Electronically Signed   By: Roanna Raider M.D.   On: 03/09/2015 01:31   I have personally reviewed and evaluated these images as part of my medical decision-making.    MDM   Final diagnoses:  Ankle sprain, right, initial encounter    Patient with likely ankle sprain. Negative x-ray. Will discharge home. Patient given ASO for ankle.  I personally performed the services described in this documentation, which was scribed in my presence. The recorded information has been reviewed and is accurate.     Benjiman Core, MD 03/09/15 [phone]

## 2016-08-10 ENCOUNTER — Encounter (HOSPITAL_COMMUNITY): Payer: Self-pay | Admitting: Emergency Medicine

## 2016-08-10 ENCOUNTER — Ambulatory Visit (HOSPITAL_COMMUNITY)
Admission: EM | Admit: 2016-08-10 | Discharge: 2016-08-10 | Disposition: A | Discharge status: Home / Self Care | Payer: PRIVATE HEALTH INSURANCE | Attending: Family Medicine | Admitting: Family Medicine

## 2016-08-10 DIAGNOSIS — M25562 Pain in left knee: Secondary | ICD-10-CM

## 2016-08-10 MED ORDER — DICLOFENAC SODIUM 75 MG PO TBEC: 75.0000 mg | DELAYED_RELEASE_TABLET | Freq: Two times a day (BID) | ORAL | 0 refills | Status: DC

## 2016-08-10 NOTE — ED Triage Notes (Signed)
[facility] a [facility] pain x 1 week. The patient denied any known injury.

## 2016-08-10 NOTE — ED Provider Notes (Signed)
  [facility]   [id number] 08/10/16 Arrival Time: 1755  ASSESSMENT & PLAN:  1. Acute pain of left knee     Meds ordered this encounter  Medications  . diclofenac (VOLTAREN) 75 MG EC tablet    Sig: Take 1 tablet (75 mg total) by mouth 2 (two) times daily.    Dispense:  14 tablet    Refill:  0   Trial of NSAID first. Able to sit at her job. Will f/u in one week if not improving, sooner if needed.  Reviewed expectations re: course of current medical issues. Questions answered. Outlined signs and symptoms indicating need for more acute intervention. Patient verbalized understanding. After Visit Summary given.   SUBJECTIVE:  Colleen Rowe is a 56 y.o. female who presents with complaint of L knee pain. Gradual onset over the past week. No injury or trauma. No previous problems with L knee. Ambulatory. Describes discomfort as a "throb". No specific aggravating or alleviating factors reported. No OTC treatment. No LE sensation changes.  ROS: As per HPI.   OBJECTIVE:  Vitals:   08/10/16 1833  BP: (!) 167/71  Pulse: 96  Resp: 18  Temp: 98.8 F (37.1 C)  TempSrc: Oral  SpO2: 96%     General appearance: alert; no distress Extremities: L knee with slight swelling when compared to the R; no specific bony tenderness; reports discomfort with knee extension; FROM; cool to touch Skin: warm and dry; no erythema over knee Neurologic: normal symmetric reflexes; normal gait but slow Psychological:  alert and cooperative; normal mood and affect  No Known Allergies  PMHx, SurgHx, SocialHx, Medications, and Allergies were reviewed in the Visit Navigator and updated as appropriate.      Mardella LaymanHagler, Yuki Purves, MD 08/10/16 [phone]

## 2018-06-27 ENCOUNTER — Other Ambulatory Visit: Payer: Self-pay

## 2018-06-27 DIAGNOSIS — Z20822 Contact with and (suspected) exposure to covid-19: Secondary | ICD-10-CM

## 2018-07-01 LAB — NOVEL CORONAVIRUS, NAA: SARS-CoV-2, NAA: NOT DETECTED

## 2019-01-10 ENCOUNTER — Ambulatory Visit
Admission: EM | Admit: 2019-01-10 | Discharge: 2019-01-10 | Disposition: A | Discharge status: Home / Self Care | Payer: Self-pay

## 2019-01-10 ENCOUNTER — Other Ambulatory Visit: Payer: Self-pay

## 2019-01-10 ENCOUNTER — Encounter: Payer: Self-pay | Admitting: Emergency Medicine

## 2019-01-10 DIAGNOSIS — R300 Dysuria: Secondary | ICD-10-CM | POA: Insufficient documentation

## 2019-01-10 LAB — POCT URINALYSIS DIP (MANUAL ENTRY)
Bilirubin, UA: NEGATIVE
Glucose, UA: NEGATIVE mg/dL
Ketones, POC UA: NEGATIVE mg/dL
Nitrite, UA: NEGATIVE
Protein Ur, POC: NEGATIVE mg/dL
Spec Grav, UA: <=1.005 — AB (ref 1.010–1.025)
Urobilinogen, UA: 0.2 E.U./dL
pH, UA: 5 (ref 5.0–8.0)

## 2019-01-10 MED ORDER — NITROFURANTOIN MONOHYD MACRO 100 MG PO CAPS: 100.0000 mg | ORAL_CAPSULE | Freq: Two times a day (BID) | ORAL | 0 refills | Status: DC

## 2019-01-10 MED ORDER — PHENAZOPYRIDINE HCL 100 MG PO TABS: 100.0000 mg | ORAL_TABLET | Freq: Three times a day (TID) | ORAL | 0 refills | Status: DC | PRN

## 2019-01-10 NOTE — ED Provider Notes (Addendum)
[facility]    CSN: [id number] Arrival date & time: 01/10/19  1133      History   Chief Complaint Chief Complaint  Patient presents with  . Urinary Tract Infection    HPI Colleen Rowe is a 59 y.o. female.   Colleen Rowe 59 years old female presented to the urgent care with a complaint of dysuria for the past 7 days.  She denies a precipitating event or recent sexual encounter.  She has tried ibuprofen, Azo without relief.  Her symptoms are made worse with urination.  She reports similar symptoms in the past that improved with antibiotic treatment.  She complains of decreased amount, increased urgency.  She denies fever, chills, nausea, vomiting, abdominal pain, flank pain, hematuria, or incontinence  The history is provided by the patient. No language interpreter was used.    History reviewed. No pertinent past medical history.  There are no problems to display for this patient.   Past Surgical History:  Procedure Laterality Date  . CHOLECYSTECTOMY    . DENTAL SURGERY      OB History   No obstetric history on file.      Home Medications    Prior to Admission medications   Medication Sig Start Date End Date Taking? Authorizing Provider  acetaminophen (TYLENOL) 325 MG tablet Take 650 mg by mouth every 6 (six) hours as needed.   Yes [provider]  diclofenac (VOLTAREN) 75 MG EC tablet Take 1 tablet (75 mg total) by mouth 2 (two) times daily. 08/10/16   Vanessa Kick, MD  nitrofurantoin, macrocrystal-monohydrate, (MACROBID) 100 MG capsule Take 1 capsule (100 mg total) by mouth 2 (two) times daily. 01/10/19   Shaul Trautman, Darrelyn Hillock, FNP  phenazopyridine (PYRIDIUM) 100 MG tablet Take 1 tablet (100 mg total) by mouth 3 (three) times daily as needed for pain. 01/10/19   Franki Stemen, Darrelyn Hillock, FNP    Family History History reviewed. No pertinent family history.  Social History Social History   Tobacco Use  . Smoking status: Never Smoker  Substance Use  Topics  . Alcohol use: No  . Drug use: Never     Allergies   Patient has no known allergies.   Review of Systems Review of Systems  Constitutional: Negative.   Respiratory: Negative.   Cardiovascular: Negative.   Gastrointestinal: Negative.   Genitourinary: Positive for dysuria.  Musculoskeletal: Negative.   ROS: All other are negatives  Physical Exam Triage Vital Signs ED Triage Vitals  Enc Vitals Group     BP      Pulse      Resp      Temp      Temp src      SpO2      Weight      Height      Head Circumference      Peak Flow      Pain Score      Pain Loc      Pain Edu?      Excl. in [place]?    No data found.  Updated Vital Signs BP (!) 189/86 (BP Location: Right Arm) Comment: large cuff  Pulse 84   Temp 98.6 F (37 C) (Oral)   Resp 20   SpO2 95%   Visual Acuity Right Eye Distance:   Left Eye Distance:   Bilateral Distance:    Right Eye Near:   Left Eye Near:    Bilateral Near:     Physical Exam Constitutional:  General: She is not in acute distress.    Appearance: Normal appearance. She is normal weight. She is not toxic-appearing or diaphoretic.  Cardiovascular:     Rate and Rhythm: Normal rate and regular rhythm.     Pulses: Normal pulses.     Heart sounds: Normal heart sounds.  Pulmonary:     Effort: Pulmonary effort is normal. No respiratory distress.     Breath sounds: Normal breath sounds. No stridor. No wheezing or rhonchi.  Chest:     Chest wall: No tenderness.  Abdominal:     General: Abdomen is flat. Bowel sounds are normal. There is no distension.     Palpations: Abdomen is soft. There is no mass.     Tenderness: There is no abdominal tenderness. There is no right CVA tenderness, left CVA tenderness, guarding or rebound.     Hernia: No hernia is present.  Musculoskeletal:        General: No swelling, tenderness, deformity or signs of injury. Normal range of motion.  Neurological:     Mental Status: She is alert.      UC  Treatments / Results  Labs (all labs ordered are listed, but only abnormal results are displayed) Labs Reviewed  POCT URINALYSIS DIP (MANUAL ENTRY) - Abnormal; Notable for the following components:      Result Value   Spec Grav, UA <=1.005 (*)    Blood, UA small (*)    Leukocytes, UA Small (1+) (*)    All other components within normal limits  URINE CULTURE    EKG   Radiology No results found.  Procedures Procedures (including critical care time)  Medications Ordered in UC Medications - No data to display  Initial Impression / Assessment and Plan / UC Course  I have reviewed the triage vital signs and the nursing notes.  Pertinent labs & imaging results that were available during my care of the patient were reviewed by me and considered in my medical decision making (see chart for details).   Plan of care urine analysis test was ordered and result review.  Results show small amount of red blood cell and leukocytes.  Urine will be sent for culture.  She is stable for discharge.  We will go ahead and treat with macrobid.  Advised patient to return for worsening of symptoms.  Patient verbalized understanding of the plan of care.  Final Clinical Impressions(s) / UC Diagnoses   Final diagnoses:  Dysuria     Discharge Instructions     Urine culture sent.  We will call you with the results.   Push fluids and get plenty of rest.   Take antibiotic as directed and to completion Take pyridium as prescribed and as needed for symptomatic relief Follow up with PCP if symptoms persists Return here or go to ER if you have any new or worsening symptoms such as fever, worsening abdominal pain, nausea/vomiting, flank pain     ED Prescriptions    Medication Sig Dispense Auth. Provider   nitrofurantoin, macrocrystal-monohydrate, (MACROBID) 100 MG capsule Take 1 capsule (100 mg total) by mouth 2 (two) times daily. 10 capsule Tonie Vizcarrondo, Zachery Dakins, FNP   phenazopyridine (PYRIDIUM) 100 MG  tablet Take 1 tablet (100 mg total) by mouth 3 (three) times daily as needed for pain. 10 tablet Daishawn Lauf, Zachery Dakins, FNP     PDMP not reviewed this encounter.   Durward Parcel, FNP 01/10/19 1214    Durward Parcel, FNP 01/10/19 1223

## 2019-01-10 NOTE — Discharge Instructions (Addendum)
Urine culture sent.  We will call you with the results.   °Push fluids and get plenty of rest.   °Take antibiotic as directed and to completion °Take pyridium as prescribed and as needed for symptomatic relief °Follow up with PCP if symptoms persists °Return here or go to ER if you have any new or worsening symptoms such as fever, worsening abdominal pain, nausea/vomiting, flank pain  °

## 2019-01-10 NOTE — ED Triage Notes (Signed)
Symptoms of uti started last Thursday.  Feels burning with urination.  Denies back and abdominal pain.

## 2019-01-13 LAB — URINE CULTURE: Culture: >=100000 — AB

## 2019-05-15 ENCOUNTER — Ambulatory Visit: Payer: Medicaid Other | Admitting: Family Medicine

## 2019-10-12 ENCOUNTER — Ambulatory Visit
Admission: EM | Admit: 2019-10-12 | Discharge: 2019-10-12 | Disposition: A | Discharge status: Home / Self Care | Payer: Medicaid Other | Attending: Emergency Medicine | Admitting: Emergency Medicine

## 2019-10-12 ENCOUNTER — Telehealth: Payer: Self-pay | Admitting: Emergency Medicine

## 2019-10-12 ENCOUNTER — Encounter: Payer: Self-pay | Admitting: Emergency Medicine

## 2019-10-12 ENCOUNTER — Other Ambulatory Visit: Payer: Self-pay

## 2019-10-12 DIAGNOSIS — R059 Cough, unspecified: Secondary | ICD-10-CM

## 2019-10-12 MED ORDER — BENZONATATE 100 MG PO CAPS: 100.0000 mg | ORAL_CAPSULE | Freq: Three times a day (TID) | ORAL | 0 refills | Status: DC

## 2019-10-12 MED ORDER — PREDNISONE 10 MG PO TABS: 20.0000 mg | ORAL_TABLET | Freq: Every day | ORAL | 0 refills | Status: DC

## 2019-10-12 MED ORDER — DEXAMETHASONE SODIUM PHOSPHATE 10 MG/ML IJ SOLN
10.0000 mg | Freq: Once | INTRAMUSCULAR | Status: AC
  Administered 2019-10-12: 10 mg via INTRAMUSCULAR

## 2019-10-12 NOTE — Discharge Instructions (Addendum)
Get plenty of rest and push fluids Tessalon Perles prescribed for cough Prescribed Tessalon Perles for cough  Prescribed prednisone use medications daily for symptom relief Use OTC medications like ibuprofen or tylenol as needed fever or pain Call or go to the ED if you have any new or worsening symptoms such as fever, worsening cough, shortness of breath, chest tightness, chest pain, turning blue, changes in mental status, etc..Marland Kitchen

## 2019-10-12 NOTE — ED Triage Notes (Signed)
Cough, pt reports she gets this every year and needs a steroid pack and cough syrup.  States she has a covid test every Monday at work that has been neg.

## 2019-10-12 NOTE — ED Provider Notes (Addendum)
[facility]   [id number] 10/12/19 Arrival Time: 1516   Chief Complaint  Patient presents with  . Cough    SUBJECTIVE: History from: patient.  Colleen Rowe is a 59 y.o. female who presented to the urgent care for complaint of cough for the past 3-4 days.  Denies sick exposure to COVID, flu or strep.  Denies recent travel.  Has tried OTC medication without relief.  Denies aggravating factor.  Denies previous symptoms in the past.   Denies fever, chills, fatigue, sinus pain, rhinorrhea, sore throat, SOB, wheezing, chest pain, nausea, changes in bowel or bladder habits.      ROS: As per HPI.  All other pertinent ROS negative.     History reviewed. No pertinent past medical history. Past Surgical History:  Procedure Laterality Date  . CHOLECYSTECTOMY    . DENTAL SURGERY     No Known Allergies No current facility-administered medications on file prior to encounter.   Current Outpatient Medications on File Prior to Encounter  Medication Sig Dispense Refill  . acetaminophen (TYLENOL) 325 MG tablet Take 650 mg by mouth every 6 (six) hours as needed.    . diclofenac (VOLTAREN) 75 MG EC tablet Take 1 tablet (75 mg total) by mouth 2 (two) times daily. 14 tablet 0  . nitrofurantoin, macrocrystal-monohydrate, (MACROBID) 100 MG capsule Take 1 capsule (100 mg total) by mouth 2 (two) times daily. 10 capsule 0  . phenazopyridine (PYRIDIUM) 100 MG tablet Take 1 tablet (100 mg total) by mouth 3 (three) times daily as needed for pain. 10 tablet 0   Social History   Socioeconomic History  . Marital status: Divorced    Spouse name: Not on file  . Number of children: Not on file  . Years of education: Not on file  . Highest education level: Not on file  Occupational History  . Not on file  Tobacco Use  . Smoking status: Never Smoker  . Smokeless tobacco: Never Used  Substance and Sexual Activity  . Alcohol use: No  . Drug use: Never  . Sexual activity: Not on file  Other  Topics Concern  . Not on file  Social History Narrative  . Not on file   Social Determinants of Health   Financial Resource Strain:   . Difficulty of Paying Living Expenses: Not on file  Food Insecurity:   . Worried About Programme researcher, broadcasting/film/video in the Last Year: Not on file  . Ran Out of Food in the Last Year: Not on file  Transportation Needs:   . Lack of Transportation (Medical): Not on file  . Lack of Transportation (Non-Medical): Not on file  Physical Activity:   . Days of Exercise per Week: Not on file  . Minutes of Exercise per Session: Not on file  Stress:   . Feeling of Stress : Not on file  Social Connections:   . Frequency of Communication with Friends and Family: Not on file  . Frequency of Social Gatherings with Friends and Family: Not on file  . Attends Religious Services: Not on file  . Active Member of Clubs or Organizations: Not on file  . Attends Banker Meetings: Not on file  . Marital Status: Not on file  Intimate Partner Violence:   . Fear of Current or Ex-Partner: Not on file  . Emotionally Abused: Not on file  . Physically Abused: Not on file  . Sexually Abused: Not on file   No family history on file.  OBJECTIVE:  Vitals:   10/12/19 1539  BP: (!) 180/97  Pulse: 78  Resp: 16  Temp: 98.4 F (36.9 C)  TempSrc: Oral  SpO2: 98%     General appearance: alert; appears fatigued, but nontoxic; speaking in full sentences and tolerating own secretions HEENT: NCAT; Ears: EACs clear, TMs pearly gray; Eyes: PERRL.  EOM grossly intact. Sinuses: nontender; Nose: nares patent without rhinorrhea, Throat: oropharynx clear, tonsils non erythematous or enlarged, uvula midline  Neck: supple without LAD Lungs: unlabored respirations, symmetrical air entry; cough: moderate; no respiratory distress; CTAB Heart: regular rate and rhythm.  Radial pulses 2+ symmetrical bilaterally Skin: warm and dry Psychological: alert and cooperative; normal mood and  affect  LABS:  No results found for this or any previous visit (from the past 24 hour(s)).   ASSESSMENT & PLAN:  1. Cough     Meds ordered this encounter  Medications  . benzonatate (TESSALON) 100 MG capsule    Sig: Take 1 capsule (100 mg total) by mouth every 8 (eight) hours.    Dispense:  30 capsule    Refill:  0  . predniSONE (DELTASONE) 10 MG tablet    Sig: Take 2 tablets (20 mg total) by mouth daily.    Dispense:  15 tablet    Refill:  0    Discharge instructions  Get plenty of rest and push fluids Tessalon Perles prescribed for cough Prescribed Tessalon Perles for cough  Prescribed prednisone use medications daily for symptom relief Use OTC medications like ibuprofen or tylenol as needed fever or pain Call or go to the ED if you have any new or worsening symptoms such as fever, worsening cough, shortness of breath, chest tightness, chest pain, turning blue, changes in mental status, etc...   Reviewed expectations re: course of current medical issues. Questions answered. Outlined signs and symptoms indicating need for more acute intervention. Patient verbalized understanding. After Visit Summary given.         Durward Parcel, FNP 10/12/19 1646    Durward Parcel, FNP 10/12/19 [phone]

## 2019-10-16 ENCOUNTER — Telehealth: Payer: Self-pay | Admitting: Emergency Medicine

## 2019-10-16 MED ORDER — HYDROCODONE-HOMATROPINE 5-1.5 MG/5ML PO SYRP
5.0000 mL | ORAL_SOLUTION | Freq: Two times a day (BID) | ORAL | 0 refills | Status: DC | PRN
Start: 2019-10-16 — End: 2019-10-23

## 2019-10-16 NOTE — Telephone Encounter (Signed)
Requesting medication for cough.  Tessalon and prednisone not working.  Will return in 1-2 days if not improving

## 2019-10-23 ENCOUNTER — Encounter (HOSPITAL_COMMUNITY): Payer: Self-pay | Admitting: *Deleted

## 2019-10-23 ENCOUNTER — Other Ambulatory Visit: Payer: Self-pay

## 2019-10-23 ENCOUNTER — Ambulatory Visit (HOSPITAL_COMMUNITY)
Admission: EM | Admit: 2019-10-23 | Discharge: 2019-10-23 | Disposition: A | Discharge status: Home / Self Care | Payer: Self-pay | Attending: Family Medicine | Admitting: Family Medicine

## 2019-10-23 DIAGNOSIS — J069 Acute upper respiratory infection, unspecified: Secondary | ICD-10-CM

## 2019-10-23 MED ORDER — CETIRIZINE HCL 10 MG PO TABS: 10.0000 mg | ORAL_TABLET | Freq: Every day | ORAL | 0 refills | Status: DC

## 2019-10-23 MED ORDER — HYDROCODONE-HOMATROPINE 5-1.5 MG/5ML PO SYRP
5.0000 mL | ORAL_SOLUTION | Freq: Two times a day (BID) | ORAL | 0 refills | Status: DC | PRN
Start: 2019-10-23 — End: 2020-08-15

## 2019-10-23 NOTE — Discharge Instructions (Addendum)
Take the medicine as prescribed Make sure that you are monitoring your blood pressures.  Contacts given for primary care for follow up.  I have put in referral.  Follow up as needed for continued or worsening symptoms

## 2019-10-23 NOTE — ED Triage Notes (Addendum)
C/O productive cough x 2 wks, but states unable to bring up sputum.  Had Covid test @ [facility], but has not yet received results; encouraged pt to call for results.  Denies fevers.  States had eval 10/12/19 - was given Rx for steroid and 2 cough meds; did not finish taking steroid, no longer taking cough med.

## 2019-10-24 NOTE — ED Provider Notes (Signed)
[facility]    CSN: [id number] Arrival date & time: 10/23/19  1601      History   Chief Complaint Chief Complaint  Patient presents with  . Cough    HPI Colleen Rowe is a 59 y.o. female.   Pt is a 58 year old female that presents with dry cough x 2 weeks. Reports nonproductive. Was given prescription for steroid into cough medications on 10/12/2019. Did not finish taking the steroid and reports spilled her cough medicine. No chest pain, shortness of breath. No fevers. Had Covid test at [facility] but has not yet received results.     History reviewed. No pertinent past medical history.  There are no problems to display for this patient.   Past Surgical History:  Procedure Laterality Date  . CHOLECYSTECTOMY    . DENTAL SURGERY      OB History   No obstetric history on file.      Home Medications    Prior to Admission medications   Medication Sig Start Date End Date Taking? Authorizing Provider  acetaminophen (TYLENOL) 325 MG tablet Take 650 mg by mouth every 6 (six) hours as needed.    [provider]  cetirizine (ZYRTEC) 10 MG tablet Take 1 tablet (10 mg total) by mouth daily. 10/23/19   Dahlia Byes A, NP  diclofenac (VOLTAREN) 75 MG EC tablet Take 1 tablet (75 mg total) by mouth 2 (two) times daily. 08/10/16   Mardella Layman, MD  HYDROcodone-homatropine [facility]) 5-1.5 MG/5ML syrup Take 5 mLs by mouth every 12 (twelve) hours as needed for cough. 10/23/19   Janace Aris, NP    Family History History reviewed. No pertinent family history.  Social History Social History   Tobacco Use  . Smoking status: Never Smoker  . Smokeless tobacco: Never Used  Vaping Use  . Vaping Use: Never used  Substance Use Topics  . Alcohol use: Not Currently  . Drug use: Never     Allergies   Benadryl [diphenhydramine]   Review of Systems Review of Systems   Physical Exam Triage Vital Signs ED Triage Vitals  Enc Vitals Group     BP 10/23/19  0909 (!) 173/85     Pulse Rate 10/23/19 0909 78     Resp 10/23/19 0909 18     Temp 10/23/19 0909 98.8 F (37.1 C)     Temp Source 10/23/19 0909 Oral     SpO2 10/23/19 0909 98 %     Weight --      Height --      Head Circumference --      Peak Flow --      Pain Score 10/23/19 0911 0     Pain Loc --      Pain Edu? --      Excl. in GC? --    No data found.  Updated Vital Signs BP (!) 173/85   Pulse 78   Temp 98.8 F (37.1 C) (Oral)   Resp 18   SpO2 98%   Visual Acuity Right Eye Distance:   Left Eye Distance:   Bilateral Distance:    Right Eye Near:   Left Eye Near:    Bilateral Near:     Physical Exam Vitals and nursing note reviewed.  Constitutional:      General: She is not in acute distress.    Appearance: Normal appearance. She is not ill-appearing, toxic-appearing or diaphoretic.  HENT:     Head: Normocephalic.  Nose: Nose normal.     Mouth/Throat:     Pharynx: Oropharynx is clear.  Eyes:     Conjunctiva/sclera: Conjunctivae normal.  Cardiovascular:     Rate and Rhythm: Normal rate and regular rhythm.  Pulmonary:     Effort: Pulmonary effort is normal.     Breath sounds: Normal breath sounds.  Musculoskeletal:        General: Normal range of motion.     Cervical back: Normal range of motion.  Skin:    General: Skin is warm and dry.     Findings: No rash.  Neurological:     Mental Status: She is alert.  Psychiatric:        Mood and Affect: Mood normal.      UC Treatments / Results  Labs (all labs ordered are listed, but only abnormal results are displayed) Labs Reviewed - No data to display  EKG   Radiology No results found.  Procedures Procedures (including critical care time)  Medications Ordered in UC Medications - No data to display  Initial Impression / Assessment and Plan / UC Course  I have reviewed the triage vital signs and the nursing notes.  Pertinent labs & imaging results that were available during my care of the  patient were reviewed by me and considered in my medical decision making (see chart for details).     Viral URI with cough. Most likely patient has post viral cough versus allergies. Lungs clear on exam. Recommended Zyrtec daily and refill the cough medication to use as needed. Patient has appointment to see primary care and recommend follow-up for her elevated blood pressure. Recommended monitoring blood pressures at home Follow up as needed for continued or worsening symptoms  Final Clinical Impressions(s) / UC Diagnoses   Final diagnoses:  Viral URI with cough     Discharge Instructions     Take the medicine as prescribed Make sure that you are monitoring your blood pressures.  Contacts given for primary care for follow up.  I have put in referral.  Follow up as needed for continued or worsening symptoms     ED Prescriptions    Medication Sig Dispense Auth. Provider   HYDROcodone-homatropine (HYCODAN) 5-1.5 MG/5ML syrup Take 5 mLs by mouth every 12 (twelve) hours as needed for cough. 60 mL Reya Aurich A, NP   cetirizine (ZYRTEC) 10 MG tablet Take 1 tablet (10 mg total) by mouth daily. 30 tablet Dahlia Byes A, NP     PDMP not reviewed this encounter.   Janace Aris, NP 10/24/19 [phone]

## 2020-06-05 ENCOUNTER — Encounter (HOSPITAL_COMMUNITY): Payer: Self-pay

## 2020-06-05 ENCOUNTER — Other Ambulatory Visit: Payer: Self-pay

## 2020-06-05 ENCOUNTER — Ambulatory Visit (HOSPITAL_COMMUNITY)
Admission: EM | Admit: 2020-06-05 | Discharge: 2020-06-05 | Disposition: A | Discharge status: Home / Self Care | Payer: Self-pay

## 2020-06-05 DIAGNOSIS — R059 Cough, unspecified: Secondary | ICD-10-CM

## 2020-06-05 DIAGNOSIS — J3081 Allergic rhinitis due to animal (cat) (dog) hair and dander: Secondary | ICD-10-CM

## 2020-06-05 DIAGNOSIS — J302 Other seasonal allergic rhinitis: Secondary | ICD-10-CM

## 2020-06-05 MED ORDER — DEXTROMETHORPHAN HBR 15 MG/5ML PO SYRP: 10.0000 mL | ORAL_SOLUTION | Freq: Four times a day (QID) | ORAL | 0 refills | Status: DC | PRN

## 2020-06-05 NOTE — Discharge Instructions (Signed)
Will need to see a pcp for yearly physical and tx of hp  Take allergy meds daily  Avoid triggers

## 2020-06-05 NOTE — ED Triage Notes (Signed)
Pt reports eye discomfort and burning sensation x 2 days;cough x 5 weeks.

## 2020-06-05 NOTE — ED Provider Notes (Signed)
[facility]    CSN: [id number] Arrival date & time: 06/05/20  0806      History   Chief Complaint Chief Complaint  Patient presents with  . Cough  . Eye Problem    HPI Colleen Rowe is a 60 y.o. female.   Pt is here for bil eye irritation and cough for 5 weeks now after being around a dog daily. States that she does not know of any allergy to animal hair. She does have seasonal allergies that she takes allegra for. Denies any sob, no fever, no chest pain. Does not have a pcp and would like to get help obtaining one. Denies any eye injury. Pt also works night shift and has not taken her meds this am for bp.. pt states that this same thing happened last year and was seen, given some type of meds and it helped. She would like to get more of this.      History reviewed. No pertinent past medical history.  There are no problems to display for this patient.   Past Surgical History:  Procedure Laterality Date  . CHOLECYSTECTOMY    . DENTAL SURGERY      OB History   No obstetric history on file.      Home Medications    Prior to Admission medications   Medication Sig Start Date End Date Taking? Authorizing Provider  dextromethorphan 15 MG/5ML syrup Take 10 mLs (30 mg total) by mouth 4 (four) times daily as needed for cough. 06/05/20  Yes Coralyn Mark, NP  Ferrous Gluconate-C-Folic Acid (IRON-C PO) Take by mouth.   Yes [provider]  Menaquinone-7 (VITAMIN K2 PO) Take by mouth.   Yes [provider]  zinc gluconate 50 MG tablet Take 50 mg by mouth daily.   Yes [provider]  acetaminophen (TYLENOL) 325 MG tablet Take 650 mg by mouth every 6 (six) hours as needed.    [provider]  cetirizine (ZYRTEC) 10 MG tablet Take 1 tablet (10 mg total) by mouth daily. 10/23/19   Dahlia Byes A, NP  diclofenac (VOLTAREN) 75 MG EC tablet Take 1 tablet (75 mg total) by mouth 2 (two) times daily. 08/10/16   Mardella Layman, MD   HYDROcodone-homatropine [facility]) 5-1.5 MG/5ML syrup Take 5 mLs by mouth every 12 (twelve) hours as needed for cough. 10/23/19   Janace Aris, NP    Family History History reviewed. No pertinent family history.  Social History Social History   Tobacco Use  . Smoking status: Never Smoker  . Smokeless tobacco: Never Used  Vaping Use  . Vaping Use: Never used  Substance Use Topics  . Alcohol use: Not Currently  . Drug use: Never     Allergies   Benadryl [diphenhydramine]   Review of Systems Review of Systems  Constitutional: Negative.   HENT: Negative.   Eyes: Positive for redness and itching. Negative for photophobia, pain and discharge.  Respiratory: Positive for cough.   Cardiovascular: Negative.   Gastrointestinal: Negative.   Genitourinary: Negative.   Neurological: Negative.      Physical Exam Triage Vital Signs ED Triage Vitals  Enc Vitals Group     BP 06/05/20 0838 (!) 183/89     Pulse Rate 06/05/20 0838 87     Resp 06/05/20 0838 18     Temp 06/05/20 0838 98.1 F (36.7 C)     Temp Source 06/05/20 0838 Oral     SpO2 06/05/20 0838 99 %  Weight --      Height --      Head Circumference --      Peak Flow --      Pain Score 06/05/20 0834 0     Pain Loc --      Pain Edu? --      Excl. in GC? --    No data found.  Updated Vital Signs BP (!) 183/89 (BP Location: Left Arm)   Pulse 87   Temp 98.1 F (36.7 C) (Oral)   Resp 18   SpO2 99%   Visual Acuity Right Eye Distance:   Left Eye Distance:   Bilateral Distance:    Right Eye Near:   Left Eye Near:    Bilateral Near:     Physical Exam Constitutional:      Appearance: She is obese.  HENT:     Right Ear: Tympanic membrane normal.     Left Ear: Tympanic membrane normal.     Nose: Nose normal.     Mouth/Throat:     Mouth: Mucous membranes are moist.  Eyes:     Pupils: Pupils are equal, round, and reactive to light.  Cardiovascular:     Rate and Rhythm: Normal rate.     Pulses:  Normal pulses.  Pulmonary:     Effort: Pulmonary effort is normal.  Abdominal:     General: Abdomen is flat.  Neurological:     General: No focal deficit present.     Mental Status: She is alert.      UC Treatments / Results  Labs (all labs ordered are listed, but only abnormal results are displayed) Labs Reviewed - No data to display  EKG   Radiology No results found.  Procedures Procedures (including critical care time)  Medications Ordered in UC Medications - No data to display  Initial Impression / Assessment and Plan / UC Course  I have reviewed the triage vital signs and the nursing notes.  Pertinent labs & imaging results that were available during my care of the patient were reviewed by me and considered in my medical decision making (see chart for details).     Discussed with pt about taking allergy medications daily, may need to switch if needed Will place in for a pcp assistance.  Pt needs to see an allergy specialist if symptoms persist.  If she has sob or chest pain needs to go to er   Final Clinical Impressions(s) / UC Diagnoses   Final diagnoses:  Cough  Seasonal allergies  Animal dander allergy     Discharge Instructions     Will need to see a pcp for yearly physical and tx of hp  Take allergy meds daily  Avoid triggers     ED Prescriptions    Medication Sig Dispense Auth. Provider   dextromethorphan 15 MG/5ML syrup Take 10 mLs (30 mg total) by mouth 4 (four) times daily as needed for cough. 120 mL Coralyn Mark, NP     PDMP not reviewed this encounter.   Coralyn Mark, NP 06/05/20 [phone]

## 2020-07-14 ENCOUNTER — Ambulatory Visit: Payer: Medicaid Other | Admitting: Internal Medicine

## 2020-08-05 ENCOUNTER — Ambulatory Visit: Payer: Medicaid Other | Admitting: Internal Medicine

## 2020-08-15 ENCOUNTER — Encounter: Payer: Self-pay | Admitting: Internal Medicine

## 2020-08-15 ENCOUNTER — Ambulatory Visit (INDEPENDENT_AMBULATORY_CARE_PROVIDER_SITE_OTHER): Payer: 59 | Admitting: Internal Medicine

## 2020-08-15 ENCOUNTER — Other Ambulatory Visit: Payer: Self-pay

## 2020-08-15 VITALS — BP 180/94 | HR 76 | Temp 98.3°F | Resp 18 | Ht 68.0 in | Wt 231.1 lb

## 2020-08-15 DIAGNOSIS — H1013 Acute atopic conjunctivitis, bilateral: Secondary | ICD-10-CM

## 2020-08-15 DIAGNOSIS — Z1159 Encounter for screening for other viral diseases: Secondary | ICD-10-CM

## 2020-08-15 DIAGNOSIS — J302 Other seasonal allergic rhinitis: Secondary | ICD-10-CM

## 2020-08-15 DIAGNOSIS — I160 Hypertensive urgency: Secondary | ICD-10-CM

## 2020-08-15 DIAGNOSIS — Z131 Encounter for screening for diabetes mellitus: Secondary | ICD-10-CM

## 2020-08-15 DIAGNOSIS — Z124 Encounter for screening for malignant neoplasm of cervix: Secondary | ICD-10-CM | POA: Diagnosis not present

## 2020-08-15 DIAGNOSIS — G4733 Obstructive sleep apnea (adult) (pediatric): Secondary | ICD-10-CM

## 2020-08-15 DIAGNOSIS — Z114 Encounter for screening for human immunodeficiency virus [HIV]: Secondary | ICD-10-CM

## 2020-08-15 DIAGNOSIS — I10 Essential (primary) hypertension: Secondary | ICD-10-CM | POA: Insufficient documentation

## 2020-08-15 DIAGNOSIS — Z1231 Encounter for screening mammogram for malignant neoplasm of breast: Secondary | ICD-10-CM

## 2020-08-15 DIAGNOSIS — Z7689 Persons encountering health services in other specified circumstances: Secondary | ICD-10-CM | POA: Diagnosis not present

## 2020-08-15 DIAGNOSIS — Z1211 Encounter for screening for malignant neoplasm of colon: Secondary | ICD-10-CM

## 2020-08-15 MED ORDER — AZELASTINE HCL 0.05 % OP SOLN: 1.0000 [drp] | Freq: Two times a day (BID) | OPHTHALMIC | 2 refills | Status: DC

## 2020-08-15 MED ORDER — TELMISARTAN 40 MG PO TABS: 40.0000 mg | ORAL_TABLET | Freq: Every day | ORAL | 0 refills | Status: DC

## 2020-08-15 NOTE — Progress Notes (Signed)
New Patient Office Visit  Subjective:  Patient ID: Colleen Rowe, female    DOB: 10/24/1960  Age: 60 y.o. MRN: [id number]  CC:  Chief Complaint  Patient presents with   New Patient (Initial Visit)    New patient has been awhile since pcp she has been waking up unable to catch her breath while sleeping     HPI Colleen Rowe is a 60 year old female with PMH of obesity who presents for establishing care. She has not had PCP for a long time.  She c/o snoring at nighttime. She has been having difficulty sleeping due to apneic episodes where she has to get up to catch a breath. These episodes have been noticed by other family members as well. She reports fatigue. She works nightshifts.  Her BP was elevated today on multiple measurements. She states that she was upset at work and came from work (nightshift). She has not slept since last evening. She denies any headache, dizziness, chest pain or palpitations.  She has been itching in her eyes since she cleaned her home using Lysol. Also reports itching in both eyes. Denies any blurry vision.  She has not had any COVID vaccine.  History reviewed. No pertinent past medical history.  Past Surgical History:  Procedure Laterality Date   CHOLECYSTECTOMY     DENTAL SURGERY      History reviewed. No pertinent family history.  Social History   Socioeconomic History   Marital status: Divorced    Spouse name: Not on file   Number of children: Not on file   Years of education: Not on file   Highest education level: Not on file  Occupational History   Not on file  Tobacco Use   Smoking status: Never   Smokeless tobacco: Never  Vaping Use   Vaping Use: Never used  Substance and Sexual Activity   Alcohol use: Not Currently   Drug use: Never   Sexual activity: Not on file  Other Topics Concern   Not on file  Social History Narrative   Not on file   Social Determinants of Health   Financial Resource Strain: Not on file  Food  Insecurity: Not on file  Transportation Needs: Not on file  Physical Activity: Not on file  Stress: Not on file  Social Connections: Not on file  Intimate Partner Violence: Not on file    ROS Review of Systems  Constitutional:  Positive for fatigue. Negative for chills and fever.  HENT:  Negative for congestion, sinus pressure, sinus pain and sore throat.   Eyes:  Negative for pain and discharge.  Respiratory:  Positive for apnea. Negative for cough and shortness of breath.   Cardiovascular:  Negative for chest pain and palpitations.  Gastrointestinal:  Negative for abdominal pain, constipation, diarrhea, nausea and vomiting.  Endocrine: Negative for polydipsia and polyuria.  Genitourinary:  Negative for dysuria and hematuria.  Musculoskeletal:  Negative for neck pain and neck stiffness.  Skin:  Negative for rash.  Neurological:  Negative for dizziness and weakness.  Psychiatric/Behavioral:  Negative for agitation and behavioral problems. The patient is nervous/anxious.    Objective:   Today's Vitals: BP (!) 180/94 (BP Location: Right Arm, Cuff Size: Normal)   Pulse 76   Temp 98.3 F (36.8 C) (Oral)   Resp 18   Ht '5\' 8"'  (1.727 m)   Wt 231 lb 1.3 oz (104.8 kg)   SpO2 98%   BMI 35.14 kg/m   Physical Exam Vitals reviewed.  Constitutional:      General: She is not in acute distress.    Appearance: She is not diaphoretic.  HENT:     Head: Normocephalic and atraumatic.     Nose: Nose normal.     Mouth/Throat:     Mouth: Mucous membranes are moist.  Eyes:     General: No scleral icterus.    Extraocular Movements: Extraocular movements intact.  Cardiovascular:     Rate and Rhythm: Normal rate and regular rhythm.     Pulses: Normal pulses.     Heart sounds: Normal heart sounds. No murmur heard. Pulmonary:     Breath sounds: Normal breath sounds. No wheezing or rales.  Abdominal:     Palpations: Abdomen is soft.     Tenderness: There is no abdominal tenderness.   Musculoskeletal:     Cervical back: Neck supple. No tenderness.     Right lower leg: No edema.     Left lower leg: No edema.  Skin:    General: Skin is warm.     Findings: No rash.  Neurological:     General: No focal deficit present.     Mental Status: She is alert and oriented to person, place, and time.     Sensory: No sensory deficit.     Motor: No weakness.  Psychiatric:        Mood and Affect: Mood normal.        Behavior: Behavior normal.    Assessment & Plan:   Problem List Items Addressed This Visit       Cardiovascular and Mediastinum   Hypertensive urgency    BP Readings from Last 1 Encounters:  08/15/20 (!) 180/94  New-onset Appears to be component of OSA Started Telmisartan 40 mg QD Counseled for compliance with the medications Advised DASH diet and moderate exercise/walking, at least 150 mins/week  EKG: Sinus rhythm. HR 64. Nonspecific T wave inversions.       Relevant Medications   telmisartan (MICARDIS) 40 MG tablet     Respiratory   OSA (obstructive sleep apnea)    STOP-BANG: 6 High risk for OSA Referred to Pulmonology for sleep study      Relevant Orders   Ambulatory referral to Pulmonology     Other   Encounter to establish care - Primary    Care established History and medications reviewed with the patient      Relevant Orders   CBC with Differential/Platelet   CMP14+EGFR   Lipid Profile   TSH + free T4   Vitamin D (25 hydroxy)   EKG 12-Lead (Completed)   Seasonal allergies    Takes Claritin as needed      Other Visit Diagnoses     Screening mammogram for breast cancer       Relevant Orders   MM 3D SCREEN BREAST BILATERAL   Routine cervical smear       Relevant Orders   Ambulatory referral to Obstetrics / Gynecology   Screening for colon cancer       Relevant Orders   Ambulatory referral to Gastroenterology   Screening for diabetes mellitus (DM)       Relevant Orders   CMP14+EGFR   HgB A1c   Need for hepatitis C  screening test       Relevant Orders   Hepatitis C Antibody   Encounter for screening for HIV       Relevant Orders   HIV antibody (with reflex)   Allergic conjunctivitis of both eyes  Relevant Medications   azelastine (OPTIVAR) 0.05 % ophthalmic solution       Outpatient Encounter Medications as of 08/15/2020  Medication Sig   acetaminophen (TYLENOL) 325 MG tablet Take 650 mg by mouth every 6 (six) hours as needed.   azelastine (OPTIVAR) 0.05 % ophthalmic solution Place 1 drop into both eyes 2 (two) times daily.   cetirizine (ZYRTEC) 10 MG tablet Take 1 tablet (10 mg total) by mouth daily.   telmisartan (MICARDIS) 40 MG tablet Take 1 tablet (40 mg total) by mouth daily.   [DISCONTINUED] dextromethorphan 15 MG/5ML syrup Take 10 mLs (30 mg total) by mouth 4 (four) times daily as needed for cough. (Patient not taking: Reported on 08/15/2020)   [DISCONTINUED] diclofenac (VOLTAREN) 75 MG EC tablet Take 1 tablet (75 mg total) by mouth 2 (two) times daily. (Patient not taking: Reported on 08/15/2020)   [DISCONTINUED] Ferrous Gluconate-C-Folic Acid (IRON-C PO) Take by mouth. (Patient not taking: Reported on 08/15/2020)   [DISCONTINUED] HYDROcodone-homatropine (HYCODAN) 5-1.5 MG/5ML syrup Take 5 mLs by mouth every 12 (twelve) hours as needed for cough. (Patient not taking: Reported on 08/15/2020)   [DISCONTINUED] Menaquinone-7 (VITAMIN K2 PO) Take by mouth. (Patient not taking: Reported on 08/15/2020)   [DISCONTINUED] zinc gluconate 50 MG tablet Take 50 mg by mouth daily. (Patient not taking: Reported on 08/15/2020)   No facility-administered encounter medications on file as of 08/15/2020.    Follow-up: Return in about 3 weeks (around 09/05/2020) for HTN and blood test review.   Lindell Spar, MD

## 2020-08-15 NOTE — Assessment & Plan Note (Signed)
Takes Claritin as needed

## 2020-08-15 NOTE — Patient Instructions (Signed)
Please start taking Telmisartan as prescribed for blood pressure.  Please get fasting blood tests done before the next visit.  You are being referred to Pulmonology for sleep apnea evaluation.  You are being referred to Gastroenterologist for screening colonoscopy.

## 2020-08-15 NOTE — Assessment & Plan Note (Addendum)
BP Readings from Last 1 Encounters:  08/15/20 (!) 180/94   New-onset Appears to be component of OSA Started Telmisartan 40 mg QD Counseled for compliance with the medications Advised DASH diet and moderate exercise/walking, at least 150 mins/week  EKG: Sinus rhythm. HR 64. Nonspecific T wave inversions.

## 2020-08-15 NOTE — Assessment & Plan Note (Signed)
STOP-BANG: 6 High risk for OSA Referred to Pulmonology for sleep study 

## 2020-08-15 NOTE — Assessment & Plan Note (Signed)
Care established History and medications reviewed with the patient 

## 2020-08-19 ENCOUNTER — Encounter: Payer: Self-pay | Admitting: Internal Medicine

## 2020-09-01 ENCOUNTER — Ambulatory Visit (HOSPITAL_COMMUNITY)
Admission: RE | Admit: 2020-09-01 | Discharge: 2020-09-01 | Disposition: A | Discharge status: Home / Self Care | Payer: 59 | Source: Ambulatory Visit | Attending: Internal Medicine | Admitting: Internal Medicine

## 2020-09-01 ENCOUNTER — Other Ambulatory Visit: Payer: Self-pay

## 2020-09-01 DIAGNOSIS — Z1231 Encounter for screening mammogram for malignant neoplasm of breast: Secondary | ICD-10-CM | POA: Diagnosis present

## 2020-09-05 ENCOUNTER — Other Ambulatory Visit: Payer: Self-pay

## 2020-09-05 ENCOUNTER — Ambulatory Visit (INDEPENDENT_AMBULATORY_CARE_PROVIDER_SITE_OTHER): Payer: 59 | Admitting: Internal Medicine

## 2020-09-05 ENCOUNTER — Encounter: Payer: Self-pay | Admitting: Internal Medicine

## 2020-09-05 VITALS — BP 170/92 | HR 86 | Temp 97.9°F | Resp 20 | Ht 68.0 in | Wt 228.0 lb

## 2020-09-05 DIAGNOSIS — J302 Other seasonal allergic rhinitis: Secondary | ICD-10-CM | POA: Diagnosis not present

## 2020-09-05 DIAGNOSIS — G4733 Obstructive sleep apnea (adult) (pediatric): Secondary | ICD-10-CM | POA: Diagnosis not present

## 2020-09-05 DIAGNOSIS — Z23 Encounter for immunization: Secondary | ICD-10-CM | POA: Diagnosis not present

## 2020-09-05 DIAGNOSIS — I10 Essential (primary) hypertension: Secondary | ICD-10-CM | POA: Diagnosis not present

## 2020-09-05 MED ORDER — AMLODIPINE BESYLATE 5 MG PO TABS: 5.0000 mg | ORAL_TABLET | Freq: Every day | ORAL | 0 refills | Status: DC

## 2020-09-05 MED ORDER — OLMESARTAN MEDOXOMIL-HCTZ 40-12.5 MG PO TABS: 1.0000 | ORAL_TABLET | Freq: Every day | ORAL | 1 refills | Status: DC

## 2020-09-05 NOTE — Assessment & Plan Note (Signed)
Takes Claritin as needed

## 2020-09-05 NOTE — Patient Instructions (Signed)
Please start taking Olmesartan-HCTZ and Amlodipine. Stop taking Telmisartan.  Please continue to follow DASH diet and perform moderate exercise/walking at least 150 mins/week.

## 2020-09-05 NOTE — Assessment & Plan Note (Signed)
BP Readings from Last 1 Encounters:  09/05/20 (!) 170/92   Uncontrolled with Telmisartan Switched to [facility] due to insurance preference Started Amlodipine 5 mg QD Appears to be component of OSA Counseled for compliance with the medications Advised DASH diet and moderate exercise/walking, at least 150 mins/week

## 2020-09-05 NOTE — Assessment & Plan Note (Signed)
STOP-BANG: 6 High risk for OSA Referred to Pulmonology for sleep study

## 2020-09-05 NOTE — Progress Notes (Signed)
Established Patient Office Visit  Subjective:  Patient ID: Colleen Rowe, female    DOB: November 11, 1960  Age: 60 y.o. MRN: [id number]  CC:  Chief Complaint  Patient presents with   Hypertension    HPI Colleen Rowe is a 60 year old female with PMH of HTN and obesity who presents for follow up of her HTN.  Her BP was elevated today as well, but better compared to prior. She states that she is feeling better now and does not have headache. Denies any chest pain, dyspnea or palpitations.  She received Tdap in the office today. Prefers to wait for flu vaccine for now.  She had Mammography, which was normal. Has not had blood tests yet.    History reviewed. No pertinent past medical history.  Past Surgical History:  Procedure Laterality Date   CHOLECYSTECTOMY     DENTAL SURGERY      History reviewed. No pertinent family history.  Social History   Socioeconomic History   Marital status: Divorced    Spouse name: Not on file   Number of children: Not on file   Years of education: Not on file   Highest education level: Not on file  Occupational History   Not on file  Tobacco Use   Smoking status: Never   Smokeless tobacco: Never  Vaping Use   Vaping Use: Never used  Substance and Sexual Activity   Alcohol use: Not Currently   Drug use: Never   Sexual activity: Not on file  Other Topics Concern   Not on file  Social History Narrative   Not on file   Social Determinants of Health   Financial Resource Strain: Not on file  Food Insecurity: Not on file  Transportation Needs: Not on file  Physical Activity: Not on file  Stress: Not on file  Social Connections: Not on file  Intimate Partner Violence: Not on file    Outpatient Medications Prior to Visit  Medication Sig Dispense Refill   acetaminophen (TYLENOL) 325 MG tablet Take 650 mg by mouth every 6 (six) hours as needed.     azelastine (OPTIVAR) 0.05 % ophthalmic solution Place 1 drop into both eyes 2 (two)  times daily. 6 mL 2   cetirizine (ZYRTEC) 10 MG tablet Take 1 tablet (10 mg total) by mouth daily. 30 tablet 0   telmisartan (MICARDIS) 40 MG tablet Take 1 tablet (40 mg total) by mouth daily. 30 tablet 0   No facility-administered medications prior to visit.    No Active Allergies  ROS Review of Systems  Constitutional:  Negative for chills and fever.  HENT:  Negative for congestion, sinus pressure, sinus pain and sore throat.   Eyes:  Negative for pain and discharge.  Respiratory:  Positive for apnea. Negative for cough and shortness of breath.   Cardiovascular:  Negative for chest pain and palpitations.  Gastrointestinal:  Negative for abdominal pain, constipation, diarrhea, nausea and vomiting.  Endocrine: Negative for polydipsia and polyuria.  Genitourinary:  Negative for dysuria and hematuria.  Musculoskeletal:  Negative for neck pain and neck stiffness.  Skin:  Negative for rash.  Neurological:  Negative for dizziness and weakness.  Psychiatric/Behavioral:  Negative for agitation and behavioral problems.      Objective:    Physical Exam Vitals reviewed.  Constitutional:      General: She is not in acute distress.    Appearance: She is not diaphoretic.  HENT:     Head: Normocephalic and atraumatic.  Nose: Nose normal.     Mouth/Throat:     Mouth: Mucous membranes are moist.  Eyes:     General: No scleral icterus.    Extraocular Movements: Extraocular movements intact.  Cardiovascular:     Rate and Rhythm: Normal rate and regular rhythm.     Pulses: Normal pulses.     Heart sounds: Normal heart sounds. No murmur heard. Pulmonary:     Breath sounds: Normal breath sounds. No wheezing or rales.  Abdominal:     Palpations: Abdomen is soft.     Tenderness: There is no abdominal tenderness.  Musculoskeletal:     Cervical back: Neck supple. No tenderness.     Right lower leg: No edema.     Left lower leg: No edema.  Skin:    General: Skin is warm.     Findings:  No rash.  Neurological:     General: No focal deficit present.     Mental Status: She is alert and oriented to person, place, and time.     Sensory: No sensory deficit.     Motor: No weakness.  Psychiatric:        Mood and Affect: Mood normal.        Behavior: Behavior normal.    BP (!) 170/92 (BP Location: Left Arm, Cuff Size: Normal)   Pulse 86   Temp 97.9 F (36.6 C)   Resp 20   Ht 5\' 8"  (1.727 m)   Wt 228 lb (103.4 kg)   SpO2 99%   BMI 34.67 kg/m  Wt Readings from Last 3 Encounters:  09/05/20 228 lb (103.4 kg)  08/15/20 231 lb 1.3 oz (104.8 kg)  03/09/15 165 lb (74.8 kg)     Health Maintenance Due  Topic Date Due   COVID-19 Vaccine (1) Never done   Hepatitis C Screening  Never done   TETANUS/TDAP  Never done   PAP SMEAR-Modifier  Never done   COLONOSCOPY (Pts 45-42yrs Insurance coverage will need to be confirmed)  Never done   Zoster Vaccines- Shingrix (1 of 2) Never done   INFLUENZA VACCINE  Never done    There are no preventive care reminders to display for this patient.   Lab Results  Component Value Date   WBC 9.0 03/28/2014   HGB 12.4 03/28/2014   HCT 38.5 03/28/2014   MCV 83.2 03/28/2014   PLT 240 03/28/2014   Lab Results  Component Value Date   NA 139 03/28/2014   K 3.4 (L) 03/28/2014   CO2 22 03/28/2014   GLUCOSE 179 (H) 03/28/2014   BUN 12 03/28/2014   CREATININE 0.93 03/28/2014   BILITOT 0.4 03/28/2014   ALKPHOS 117 03/28/2014   AST 39 (H) 03/28/2014   ALT 35 03/28/2014   PROT 7.5 03/28/2014   ALBUMIN 3.9 03/28/2014   CALCIUM 8.8 03/28/2014   ANIONGAP 9 03/28/2014   Lab Results  Component Value Date   CHOL  04/15/2009    131        ATP III CLASSIFICATION:  <200     mg/dL   Desirable  06/15/2009  mg/dL   Borderline High  109-323    mg/dL   High          Lab Results  Component Value Date   HDL 37 (L) 04/15/2009   Lab Results  Component Value Date   [facility]  04/15/2009    81        Total Cholesterol/HDL:CHD Risk Coronary  Heart Disease Risk Table  Men   Women  1/2 Average Risk   3.4   3.3  Average Risk       5.0   4.4  2 X Average Risk   9.6   7.1  3 X Average Risk  23.4   11.0        Use the calculated Patient Ratio above and the CHD Risk Table to determine the patient's CHD Risk.        ATP III CLASSIFICATION (LDL):  <100     mg/dL   Optimal  413-244  mg/dL   Near or Above                    Optimal  130-159  mg/dL   Borderline  010-272  mg/dL   High  >536     mg/dL   Very High   Lab Results  Component Value Date   TRIG 64 04/15/2009   Lab Results  Component Value Date   CHOLHDL 3.5 04/15/2009   Lab Results  Component Value Date   HGBA1C (H) 04/15/2009    6.6 (NOTE)                                                                       According to the ADA Clinical Practice Recommendations for 2011, when HbA1c is used as a screening test:   >=6.5%   Diagnostic of Diabetes Mellitus           (if abnormal result  is confirmed)  5.7-6.4%   Increased risk of developing Diabetes Mellitus  References:Diagnosis and Classification of Diabetes Mellitus,Diabetes Care,2011,34(Suppl 1):S62-S69 and Standards of Medical Care in         Diabetes - 2011,Diabetes Care,2011,34  (Suppl 1):S11-S61.      Assessment & Plan:   Problem List Items Addressed This Visit       Cardiovascular and Mediastinum   Essential hypertension - Primary    BP Readings from Last 1 Encounters:  09/05/20 (!) 170/92  Uncontrolled with Telmisartan Switched to [facility] due to insurance preference Started Amlodipine 5 mg QD Appears to be component of OSA Counseled for compliance with the medications Advised DASH diet and moderate exercise/walking, at least 150 mins/week       Relevant Medications   amLODipine (NORVASC) 5 MG tablet   olmesartan-hydrochlorothiazide (BENICAR HCT) 40-12.5 MG tablet     Respiratory   OSA (obstructive sleep apnea)    STOP-BANG: 6 High risk for OSA Referred to  Pulmonology for sleep study        Other   Seasonal allergies    Takes Claritin as needed       Meds ordered this encounter  Medications   amLODipine (NORVASC) 5 MG tablet    Sig: Take 1 tablet (5 mg total) by mouth daily.    Dispense:  90 tablet    Refill:  0   olmesartan-hydrochlorothiazide (BENICAR HCT) 40-12.5 MG tablet    Sig: Take 1 tablet by mouth daily.    Dispense:  30 tablet    Refill:  1    Follow-up: Return in about 6 weeks (around 10/17/2020) for HTN.    Anabel Halon, MD

## 2020-09-05 NOTE — Addendum Note (Signed)
Addended by: Dellia Cloud on: 09/05/2020 03:26 PM   Modules accepted: Orders

## 2020-09-10 ENCOUNTER — Other Ambulatory Visit: Payer: Self-pay | Admitting: Internal Medicine

## 2020-09-10 DIAGNOSIS — E1169 Type 2 diabetes mellitus with other specified complication: Secondary | ICD-10-CM | POA: Insufficient documentation

## 2020-09-10 DIAGNOSIS — E782 Mixed hyperlipidemia: Secondary | ICD-10-CM

## 2020-09-10 DIAGNOSIS — E119 Type 2 diabetes mellitus without complications: Secondary | ICD-10-CM | POA: Insufficient documentation

## 2020-09-10 LAB — LIPID PANEL
Chol/HDL Ratio: 3.3 ratio (ref 0.0–4.4)
Cholesterol, Total: 154 mg/dL (ref 100–199)
HDL: 46 mg/dL (ref >39)
LDL Chol Calc (NIH): 95 mg/dL (ref 0–99)
Triglycerides: 64 mg/dL (ref 0–149)
VLDL Cholesterol Cal: 13 mg/dL (ref 5–40)

## 2020-09-10 LAB — CBC WITH DIFFERENTIAL/PLATELET
Basophils Absolute: 0 10*3/uL (ref 0.0–0.2)
Basos: 0 %
EOS (ABSOLUTE): 0.3 10*3/uL (ref 0.0–0.4)
Eos: 3 %
Hematocrit: 39.6 % (ref 34.0–46.6)
Hemoglobin: 12.8 g/dL (ref 11.1–15.9)
Immature Grans (Abs): 0 10*3/uL (ref 0.0–0.1)
Immature Granulocytes: 0 %
Lymphocytes Absolute: 3.9 10*3/uL — ABNORMAL HIGH (ref 0.7–3.1)
Lymphs: 41 %
MCH: 26.8 pg (ref 26.6–33.0)
MCHC: 32.3 g/dL (ref 31.5–35.7)
MCV: 83 fL (ref 79–97)
Monocytes Absolute: 0.5 10*3/uL (ref 0.1–0.9)
Monocytes: 6 %
Neutrophils Absolute: 4.8 10*3/uL (ref 1.4–7.0)
Neutrophils: 50 %
Platelets: 235 10*3/uL (ref 150–450)
RBC: 4.77 x10E6/uL (ref 3.77–5.28)
RDW: 12.5 % (ref 11.7–15.4)
WBC: 9.5 10*3/uL (ref 3.4–10.8)

## 2020-09-10 LAB — CMP14+EGFR
ALT: 48 IU/L — ABNORMAL HIGH (ref 0–32)
AST: 52 IU/L — ABNORMAL HIGH (ref 0–40)
Albumin/Globulin Ratio: 1.3 (ref 1.2–2.2)
Albumin: 4.2 g/dL (ref 3.8–4.9)
Alkaline Phosphatase: 140 IU/L — ABNORMAL HIGH (ref 44–121)
BUN/Creatinine Ratio: 18 (ref 12–28)
BUN: 19 mg/dL (ref 8–27)
Bilirubin Total: 0.2 mg/dL (ref 0.0–1.2)
CO2: 23 mmol/L (ref 20–29)
Calcium: 9.6 mg/dL (ref 8.7–10.3)
Chloride: 101 mmol/L (ref 96–106)
Creatinine, Ser: 1.03 mg/dL — ABNORMAL HIGH (ref 0.57–1.00)
Globulin, Total: 3.3 g/dL (ref 1.5–4.5)
Glucose: 96 mg/dL (ref 65–99)
Potassium: 4.2 mmol/L (ref 3.5–5.2)
Sodium: 138 mmol/L (ref 134–144)
Total Protein: 7.5 g/dL (ref 6.0–8.5)
eGFR: 62 mL/min/{1.73_m2} (ref >59)

## 2020-09-10 LAB — HEMOGLOBIN A1C
Est. average glucose Bld gHb Est-mCnc: 189 mg/dL
Hgb A1c MFr Bld: 8.2 % — ABNORMAL HIGH (ref 4.8–5.6)

## 2020-09-10 LAB — TSH+FREE T4
Free T4: 0.98 ng/dL (ref 0.82–1.77)
TSH: 1.92 u[IU]/mL (ref 0.450–4.500)

## 2020-09-10 LAB — HEPATITIS C ANTIBODY: Hep C Virus Ab: <0.1 s/co ratio (ref 0.0–0.9)

## 2020-09-10 LAB — VITAMIN D 25 HYDROXY (VIT D DEFICIENCY, FRACTURES): Vit D, 25-Hydroxy: 23.4 ng/mL — ABNORMAL LOW (ref 30.0–100.0)

## 2020-09-10 LAB — HIV ANTIBODY (ROUTINE TESTING W REFLEX): HIV Screen 4th Generation wRfx: NONREACTIVE

## 2020-09-10 MED ORDER — METFORMIN HCL 500 MG PO TABS: 500.0000 mg | ORAL_TABLET | Freq: Two times a day (BID) | ORAL | 1 refills | Status: DC

## 2020-09-10 MED ORDER — ROSUVASTATIN CALCIUM 5 MG PO TABS: 5.0000 mg | ORAL_TABLET | Freq: Every day | ORAL | 3 refills | Status: DC

## 2020-09-11 ENCOUNTER — Other Ambulatory Visit: Payer: Self-pay | Admitting: *Deleted

## 2020-09-11 ENCOUNTER — Telehealth: Payer: Self-pay

## 2020-09-11 DIAGNOSIS — E782 Mixed hyperlipidemia: Secondary | ICD-10-CM

## 2020-09-11 DIAGNOSIS — E119 Type 2 diabetes mellitus without complications: Secondary | ICD-10-CM

## 2020-09-11 MED ORDER — ROSUVASTATIN CALCIUM 5 MG PO TABS: 5.0000 mg | ORAL_TABLET | Freq: Every day | ORAL | 3 refills | Status: DC

## 2020-09-11 MED ORDER — METFORMIN HCL 500 MG PO TABS: 500.0000 mg | ORAL_TABLET | Freq: Two times a day (BID) | ORAL | 1 refills | Status: DC

## 2020-09-11 NOTE — Telephone Encounter (Signed)
Medication sent to pt pharmacy 

## 2020-09-11 NOTE — Telephone Encounter (Signed)
Patient called need medicines to be sent to [facility] in [place] on [address].  metFORMIN (GLUCOPHAGE) 500 MG tablet  osuvastatin (CRESTOR) 5 MG tablet

## 2020-09-19 ENCOUNTER — Ambulatory Visit (INDEPENDENT_AMBULATORY_CARE_PROVIDER_SITE_OTHER): Payer: 59

## 2020-09-19 ENCOUNTER — Other Ambulatory Visit: Payer: Self-pay

## 2020-09-19 DIAGNOSIS — Z111 Encounter for screening for respiratory tuberculosis: Secondary | ICD-10-CM | POA: Diagnosis not present

## 2020-09-22 LAB — TB SKIN TEST
Induration: 0 mm
TB Skin Test: NEGATIVE

## 2020-10-07 ENCOUNTER — Encounter: Payer: Self-pay | Admitting: *Deleted

## 2020-10-07 ENCOUNTER — Other Ambulatory Visit: Payer: Self-pay

## 2020-10-07 ENCOUNTER — Ambulatory Visit (INDEPENDENT_AMBULATORY_CARE_PROVIDER_SITE_OTHER): Payer: Self-pay | Admitting: *Deleted

## 2020-10-07 VITALS — Ht 67.0 in | Wt 223.2 lb

## 2020-10-07 DIAGNOSIS — Z1211 Encounter for screening for malignant neoplasm of colon: Secondary | ICD-10-CM

## 2020-10-07 MED ORDER — PEG 3350-KCL-NA BICARB-NACL 420 G PO SOLR: 4000.0000 mL | Freq: Once | ORAL | 0 refills | Status: AC

## 2020-10-07 NOTE — Progress Notes (Signed)
Gastroenterology Pre-Procedure Review  Request Date: 10/07/2020 Requesting Physician: Dr. Trena Platt, no previous TCS  PATIENT REVIEW QUESTIONS: The patient responded to the following health history questions as indicated:    1. Diabetes Melitis: yes, pre-diabetic 2. Joint replacements in the past 12 months: no 3. Major health problems in the past 3 months: no 4. Has an artificial valve or MVP: no 5. Has a defibrillator: no 6. Has been advised in past to take antibiotics in advance of a procedure like teeth cleaning: no 7. Family history of colon cancer: no  8. Alcohol Use: no 9. Illicit drug Use: no 10. History of sleep apnea: no  11. History of coronary artery or other vascular stents placed within the last 12 months: no 12. History of any prior anesthesia complications: no 13. Body mass index is 34.96 kg/m.    MEDICATIONS & ALLERGIES:    Patient reports the following regarding taking any blood thinners:   Plavix? no Aspirin? no Coumadin? no Brilinta? no Xarelto? no Eliquis? no Pradaxa? no Savaysa? no Effient? no  Patient confirms/reports the following medications:  Current Outpatient Medications  Medication Sig Dispense Refill   amLODipine (NORVASC) 5 MG tablet Take 1 tablet (5 mg total) by mouth daily. 90 tablet 0   cetirizine (ZYRTEC) 10 MG tablet Take 1 tablet (10 mg total) by mouth daily. 30 tablet 0   metFORMIN (GLUCOPHAGE) 500 MG tablet Take 1 tablet (500 mg total) by mouth 2 (two) times daily with a meal. 180 tablet 1   No current facility-administered medications for this visit.    Patient confirms/reports the following allergies:  No Known Allergies  No orders of the defined types were placed in this encounter.   AUTHORIZATION INFORMATION Primary Insurance: [facility],  ID #: [id number],  Group #: [id number] Pre-Cert / Berkley Harvey required: No, not required  Secondary Insurance: Medicaid Family Planning,  ID #: [id number] Pre-Cert / Berkley Harvey required: No, not  required  SCHEDULE INFORMATION: Procedure has been scheduled as follows:  Date: 10/21/2020, Time: 9:00  Location: [facility] with Dr. Marletta Lor  This Gastroenterology Pre-Precedure Review Form is being routed to the following provider(s): Lewie Loron, NP

## 2020-10-07 NOTE — Progress Notes (Signed)
Pt goes for regular colon cleanses ([facility] [place]) once a month. Had last one on 09/08/2020.

## 2020-10-08 ENCOUNTER — Encounter: Payer: Self-pay | Admitting: *Deleted

## 2020-10-08 NOTE — Progress Notes (Signed)
Colleen Loron, NP:  Pt is also taking Iron 2 times daily.  Added to med list.  Will she need to hold for 10 days?  Also, are any diabetes medication adjustments needed?

## 2020-10-08 NOTE — Progress Notes (Signed)
Appropriate. ASA 2.  

## 2020-10-08 NOTE — Progress Notes (Signed)
Noted.  Thanks Tobi Bastos!  Mailed letter to pt with diabetes and iron adjustments.  Tried to call pt as well but had to leave a voice mail for her to call me back.

## 2020-10-08 NOTE — Progress Notes (Signed)
Yes, hold iron X 10 days. No metformin day of procedure. Thanks!

## 2020-10-09 ENCOUNTER — Encounter: Payer: Self-pay | Admitting: *Deleted

## 2020-10-13 ENCOUNTER — Ambulatory Visit (INDEPENDENT_AMBULATORY_CARE_PROVIDER_SITE_OTHER): Payer: 59 | Admitting: Adult Health

## 2020-10-13 ENCOUNTER — Other Ambulatory Visit: Payer: Self-pay

## 2020-10-13 ENCOUNTER — Encounter: Payer: Self-pay | Admitting: Adult Health

## 2020-10-13 ENCOUNTER — Other Ambulatory Visit (HOSPITAL_COMMUNITY)
Admission: RE | Admit: 2020-10-13 | Discharge: 2020-10-13 | Disposition: A | Discharge status: Home / Self Care | Payer: 59 | Source: Ambulatory Visit | Attending: Adult Health | Admitting: Adult Health

## 2020-10-13 VITALS — BP 152/81 | HR 67 | Ht 68.0 in | Wt 223.0 lb

## 2020-10-13 DIAGNOSIS — Z124 Encounter for screening for malignant neoplasm of cervix: Secondary | ICD-10-CM | POA: Diagnosis not present

## 2020-10-13 DIAGNOSIS — Z01419 Encounter for gynecological examination (general) (routine) without abnormal findings: Secondary | ICD-10-CM | POA: Insufficient documentation

## 2020-10-13 NOTE — Progress Notes (Signed)
  Subjective:     Patient ID: Colleen Rowe, female   DOB: [birth year], 60 y.o.   MRN: [id number]  HPI Colleen Rowe is a 60 year old black female,divorced, [id number] in for pelvic and pap. She had physical with PCP. PCP is Dr Allena Katz.   Review of Systems Patient denies any headaches, hearing loss, fatigue, blurred vision, shortness of breath, chest pain, abdominal pain, problems with bowel movements, urination, or intercourse(not having sex). No joint pain or mood swings.  Denies any vaginal bleeding    Reviewed past medical,surgical, social and family history. Reviewed medications and allergies.  Objective:   Physical Exam BP (!) 152/81 (BP Location: Left Arm, Patient Position: Sitting, Cuff Size: Large)   Pulse 67   Ht 5\' 8"  (1.727 m)   Wt 223 lb (101.2 kg)   BMI 33.91 kg/m     Skin warm and dry.Pelvic: external genitalia is normal in appearance no lesions, vagina: pale pink, loss of rugae,urethra has no lesions or masses noted, cervix:smooth and bulbous, uterus: normal size, shape and contour, non tender, no masses felt, adnexa: no masses or tenderness noted. Bladder is non tender and no masses felt. Rectal was deferred at her request. AA is 0 Fall risk is low Depression screen [facility] 2/9 10/13/2020 09/05/2020 08/15/2020  Decreased Interest 0 0 0  Down, Depressed, Hopeless 0 0 0  PHQ - 2 Score 0 0 0  Altered sleeping 0 - -  Tired, decreased energy 0 - -  Change in appetite 0 - -  Feeling bad or failure about yourself  0 - -  Trouble concentrating 0 - -  Moving slowly or fidgety/restless 0 - -  Suicidal thoughts 0 - -  PHQ-9 Score 0 - -    GAD 7 : Generalized Anxiety Score 10/13/2020  Nervous, Anxious, on Edge 0  Control/stop worrying 0  Worry too much - different things 0  Trouble relaxing 0  Restless 0  Easily annoyed or irritable 0  Afraid - awful might happen 0  Total GAD 7 Score 0      Upstream - 10/13/20 1052       Pregnancy Intention Screening   Does the patient want to  become pregnant in the next year? N/A    Does the patient's partner want to become pregnant in the next year? N/A    Would the patient like to discuss contraceptive options today? N/A      Contraception Wrap Up   Current Method No Method - Other Reason   post-menopausal   End Method No Method - Other Reason    Contraception Counseling Provided No             Examination chaperoned by 12/13/20 RN Assessment:     1. Encounter for cervical Pap smear with pelvic exam Pap sent Physical with PCP Pap in 3 years if normal   Mammogram yearly, had 09/01/20 negative for malignancy  Colonoscopy 10/21/20 with Dr 10/23/20 with PCP  Plan:     Pap in 3 years

## 2020-10-14 ENCOUNTER — Encounter: Payer: Self-pay | Admitting: Internal Medicine

## 2020-10-14 ENCOUNTER — Ambulatory Visit (INDEPENDENT_AMBULATORY_CARE_PROVIDER_SITE_OTHER): Payer: 59 | Admitting: Internal Medicine

## 2020-10-14 VITALS — BP 176/83 | HR 73 | Resp 18 | Ht 68.0 in | Wt 224.1 lb

## 2020-10-14 DIAGNOSIS — M7989 Other specified soft tissue disorders: Secondary | ICD-10-CM | POA: Diagnosis not present

## 2020-10-14 DIAGNOSIS — I10 Essential (primary) hypertension: Secondary | ICD-10-CM

## 2020-10-14 DIAGNOSIS — B351 Tinea unguium: Secondary | ICD-10-CM

## 2020-10-14 LAB — CYTOLOGY - PAP
Comment: NEGATIVE
Diagnosis: NEGATIVE
High risk HPV: NEGATIVE

## 2020-10-14 MED ORDER — TERBINAFINE HCL 250 MG PO TABS: 250.0000 mg | ORAL_TABLET | Freq: Every day | ORAL | 0 refills | Status: DC

## 2020-10-14 NOTE — Assessment & Plan Note (Signed)
Started Terbinafine Rash and thick nails likely due to onychomycosis If persistent, will refer to Podiatry

## 2020-10-14 NOTE — Progress Notes (Signed)
Acute Office Visit  Subjective:    Patient ID: Colleen Rowe, female    DOB: [birth year], 60 y.o.   MRN: [id number]  Chief Complaint  Patient presents with   Foot Swelling    Pt has been having left foot burning sensation swelling and rash since 10-01-20. She doesn't know what causes this but has recently started new medication     HPI Patient is in today for evaluation of left foot burning pain in between the toes and swelling around the toes. She has noticed whitish patch over the dorsal aspect. She used to wear steal top shoes at her previous workplace and had nail evulsion once. Denies any other recent injury. She has stopped taking her medications as she thought it was happening from her medications. Her BP was elevated in the office today. She denies any chest pain, dyspnea or palpitations currently.  Past Medical History:  Diagnosis Date   Allergies    Diabetes mellitus without complication ([place])    Hypertension     Past Surgical History:  Procedure Laterality Date   CHOLECYSTECTOMY     DENTAL SURGERY      Family History  Problem Relation Age of Onset   Breast cancer Mother    Diabetes Mother    Hypertension Mother    Breast cancer Sister     Social History   Socioeconomic History   Marital status: Divorced    Spouse name: Not on file   Number of children: 2   Years of education: Not on file   Highest education level: Not on file  Occupational History   Not on file  Tobacco Use   Smoking status: Never   Smokeless tobacco: Never  Vaping Use   Vaping Use: Never used  Substance and Sexual Activity   Alcohol use: Not Currently   Drug use: Never   Sexual activity: Not Currently    Birth control/protection: Post-menopausal  Other Topics Concern   Not on file  Social History Narrative   Not on file   Social Determinants of Health   Financial Resource Strain: Unknown   Difficulty of Paying Living Expenses: Patient refused  Food Insecurity: Unknown    Worried About Charity fundraiser in the Last Year: Patient refused   Ran Out of Food in the Last Year: Patient refused  Transportation Needs: No Transportation Needs   Lack of Transportation (Medical): No   Lack of Transportation (Non-Medical): No  Physical Activity: Sufficiently Active   Days of Exercise per Week: 4 days   Minutes of Exercise per Session: 100 min  Stress: No Stress Concern Present   Feeling of Stress : Not at all  Social Connections: Unknown   Frequency of Communication with Friends and Family: Patient refused   Frequency of Social Gatherings with Friends and Family: Patient refused   Attends Religious Services: Patient refused   Marine scientist or Organizations: No   Attends Music therapist: Never   Marital Status: Divorced  Human resources officer Violence: Not At Risk   Fear of Current or Ex-Partner: No   Emotionally Abused: No   Physically Abused: No   Sexually Abused: No    Outpatient Medications Prior to Visit  Medication Sig Dispense Refill   Cholecalciferol (VITAMIN D3) 50 MCG (2000 UT) TABS Take by mouth daily.     Cyanocobalamin (B-12 PO) Take by mouth daily.     olmesartan-hydrochlorothiazide (BENICAR HCT) 40-12.5 MG tablet Take 1 tablet by mouth daily.  amLODipine (NORVASC) 5 MG tablet Take 1 tablet (5 mg total) by mouth daily. (Patient not taking: No sig reported) 90 tablet 0   ferrous sulfate 325 (65 FE) MG EC tablet Take 325 mg by mouth 2 (two) times daily. (Patient not taking: No sig reported)     metFORMIN (GLUCOPHAGE) 500 MG tablet Take 1 tablet (500 mg total) by mouth 2 (two) times daily with a meal. (Patient not taking: Reported on 10/14/2020) 180 tablet 1   cetirizine (ZYRTEC) 10 MG tablet Take 1 tablet (10 mg total) by mouth daily. (Patient not taking: Reported on 10/14/2020) 30 tablet 0   No facility-administered medications prior to visit.    No Known Allergies  Review of Systems  Constitutional:  Negative for chills  and fever.  HENT:  Negative for congestion, sinus pressure, sinus pain and sore throat.   Eyes:  Negative for pain and discharge.  Respiratory:  Positive for apnea. Negative for cough and shortness of breath.   Cardiovascular:  Positive for leg swelling. Negative for chest pain and palpitations.  Gastrointestinal:  Negative for abdominal pain, constipation, diarrhea, nausea and vomiting.  Endocrine: Negative for polydipsia and polyuria.  Genitourinary:  Negative for dysuria and hematuria.  Musculoskeletal:  Negative for neck pain and neck stiffness.  Skin:  Positive for rash.  Neurological:  Negative for dizziness and weakness.  Psychiatric/Behavioral:  Negative for agitation and behavioral problems.       Objective:    Physical Exam Vitals reviewed.  Constitutional:      General: She is not in acute distress.    Appearance: She is not diaphoretic.  HENT:     Head: Normocephalic and atraumatic.     Nose: Nose normal.     Mouth/Throat:     Mouth: Mucous membranes are moist.  Eyes:     General: No scleral icterus.    Extraocular Movements: Extraocular movements intact.  Cardiovascular:     Rate and Rhythm: Normal rate and regular rhythm.     Pulses: Normal pulses.     Heart sounds: Normal heart sounds. No murmur heard. Pulmonary:     Breath sounds: Normal breath sounds. No wheezing or rales.  Musculoskeletal:     Cervical back: Neck supple. No tenderness.     Right lower leg: No edema.     Left lower leg: No edema.  Feet:     Left foot:     Skin integrity: Dry skin present.     Toenail Condition: Left toenails are abnormally thick. Fungal disease present. Skin:    General: Skin is warm.     Findings: Rash (Whitish patch over dorsum of left foot and in-between the toes) present.  Neurological:     General: No focal deficit present.     Mental Status: She is alert and oriented to person, place, and time.     Sensory: No sensory deficit.  Psychiatric:        Mood and  Affect: Mood normal.        Behavior: Behavior normal.    BP (!) 176/83 (BP Location: Left Arm, Patient Position: Sitting, Cuff Size: Normal)   Pulse 73   Resp 18   Ht '5\' 8"'  (1.727 m)   Wt 224 lb 1.9 oz (101.7 kg)   SpO2 97%   BMI 34.08 kg/m  Wt Readings from Last 3 Encounters:  10/14/20 224 lb 1.9 oz (101.7 kg)  10/13/20 223 lb (101.2 kg)  10/07/20 223 lb 3.2 oz (101.2 kg)  Health Maintenance Due  Topic Date Due   COVID-19 Vaccine (1) Never done   FOOT EXAM  Never done   OPHTHALMOLOGY EXAM  Never done   URINE MICROALBUMIN  Never done   COLONOSCOPY (Pts 45-16yr Insurance coverage will need to be confirmed)  Never done   Zoster Vaccines- Shingrix (1 of 2) Never done   INFLUENZA VACCINE  Never done    There are no preventive care reminders to display for this patient.   Lab Results  Component Value Date   TSH 1.920 09/09/2020   Lab Results  Component Value Date   WBC 9.5 09/09/2020   HGB 12.8 09/09/2020   HCT 39.6 09/09/2020   MCV 83 09/09/2020   PLT 235 09/09/2020   Lab Results  Component Value Date   NA 138 09/09/2020   K 4.2 09/09/2020   CO2 23 09/09/2020   GLUCOSE 96 09/09/2020   BUN 19 09/09/2020   CREATININE 1.03 (H) 09/09/2020   BILITOT 0.2 09/09/2020   ALKPHOS 140 (H) 09/09/2020   AST 52 (H) 09/09/2020   ALT 48 (H) 09/09/2020   PROT 7.5 09/09/2020   ALBUMIN 4.2 09/09/2020   CALCIUM 9.6 09/09/2020   ANIONGAP 9 03/28/2014   EGFR 62 09/09/2020   Lab Results  Component Value Date   CHOL 154 09/09/2020   Lab Results  Component Value Date   HDL 46 09/09/2020   Lab Results  Component Value Date   LDLCALC 95 09/09/2020   Lab Results  Component Value Date   TRIG 64 09/09/2020   Lab Results  Component Value Date   CHOLHDL 3.3 09/09/2020   Lab Results  Component Value Date   HGBA1C 8.2 (H) 09/09/2020       Assessment & Plan:   Problem List Items Addressed This Visit       Cardiovascular and Mediastinum   Essential  hypertension    BP Readings from Last 1 Encounters:  10/14/20 (!) 176/83  Uncontrolled due to noncompliance Continue Olmesartan-HCTZ  and Amlodipine 5 mg QD Appears to be component of OSA Counseled for compliance with the medications Advised DASH diet and moderate exercise/walking, at least 150 mins/week       Relevant Medications   olmesartan-hydrochlorothiazide (BENICAR HCT) 40-12.5 MG tablet     Musculoskeletal and Integument   Onychomycosis - Primary    Started Terbinafine Rash and thick nails likely due to onychomycosis If persistent, will refer to Podiatry      Relevant Medications   terbinafine (LAMISIL) 250 MG tablet     Other   Leg swelling    Leg elevation Compression socks        Meds ordered this encounter  Medications   terbinafine (LAMISIL) 250 MG tablet    Sig: Take 1 tablet (250 mg total) by mouth daily.    Dispense:  90 tablet    Refill:  0     Billal Rollo KKeith Rake MD

## 2020-10-14 NOTE — Assessment & Plan Note (Signed)
Leg elevation Compression socks

## 2020-10-14 NOTE — Assessment & Plan Note (Signed)
BP Readings from Last 1 Encounters:  10/14/20 (!) 176/83   Uncontrolled due to noncompliance Continue Olmesartan-HCTZ  and Amlodipine 5 mg QD Appears to be component of OSA Counseled for compliance with the medications Advised DASH diet and moderate exercise/walking, at least 150 mins/week

## 2020-10-14 NOTE — Patient Instructions (Signed)
Please apply Lamisil cream in between the toes to help with fungal infection.  Apply lotion or moisturizer over feet area to avoid local dryness.  Please take Terbinafine as prescribed for fungal infection.

## 2020-10-20 ENCOUNTER — Ambulatory Visit: Payer: 59 | Admitting: Internal Medicine

## 2020-10-21 ENCOUNTER — Ambulatory Visit (HOSPITAL_COMMUNITY)
Admission: RE | Admit: 2020-10-21 | Discharge: 2020-10-21 | Disposition: A | Discharge status: Home / Self Care | Payer: 59 | Attending: Internal Medicine | Admitting: Internal Medicine

## 2020-10-21 ENCOUNTER — Encounter (HOSPITAL_COMMUNITY)
Admission: RE | Disposition: A | Discharge status: Home / Self Care | Payer: Self-pay | Source: Home / Self Care | Attending: Internal Medicine

## 2020-10-21 ENCOUNTER — Other Ambulatory Visit: Payer: Self-pay

## 2020-10-21 ENCOUNTER — Encounter (HOSPITAL_COMMUNITY): Payer: Self-pay

## 2020-10-21 ENCOUNTER — Ambulatory Visit (HOSPITAL_COMMUNITY): Payer: 59 | Admitting: Anesthesiology

## 2020-10-21 DIAGNOSIS — Z8249 Family history of ischemic heart disease and other diseases of the circulatory system: Secondary | ICD-10-CM | POA: Diagnosis not present

## 2020-10-21 DIAGNOSIS — Z1211 Encounter for screening for malignant neoplasm of colon: Secondary | ICD-10-CM | POA: Diagnosis present

## 2020-10-21 DIAGNOSIS — E119 Type 2 diabetes mellitus without complications: Secondary | ICD-10-CM | POA: Diagnosis not present

## 2020-10-21 DIAGNOSIS — Z79899 Other long term (current) drug therapy: Secondary | ICD-10-CM | POA: Insufficient documentation

## 2020-10-21 DIAGNOSIS — Z7984 Long term (current) use of oral hypoglycemic drugs: Secondary | ICD-10-CM | POA: Diagnosis not present

## 2020-10-21 DIAGNOSIS — K648 Other hemorrhoids: Secondary | ICD-10-CM | POA: Diagnosis not present

## 2020-10-21 DIAGNOSIS — Z833 Family history of diabetes mellitus: Secondary | ICD-10-CM | POA: Diagnosis not present

## 2020-10-21 DIAGNOSIS — Z803 Family history of malignant neoplasm of breast: Secondary | ICD-10-CM | POA: Insufficient documentation

## 2020-10-21 HISTORY — PX: COLONOSCOPY WITH PROPOFOL: SHX5780

## 2020-10-21 LAB — GLUCOSE, CAPILLARY: Glucose-Capillary: 119 mg/dL — ABNORMAL HIGH (ref 70–99)

## 2020-10-21 SURGERY — COLONOSCOPY WITH PROPOFOL
Anesthesia: General

## 2020-10-21 MED ORDER — PROPOFOL 10 MG/ML IV BOLUS
INTRAVENOUS | Status: DC | PRN
  Administered 2020-10-21: 120 mg via INTRAVENOUS

## 2020-10-21 MED ORDER — PROPOFOL 500 MG/50ML IV EMUL
INTRAVENOUS | Status: DC | PRN
  Administered 2020-10-21: 150 ug/kg/min via INTRAVENOUS

## 2020-10-21 MED ORDER — LACTATED RINGERS IV SOLN: INTRAVENOUS | Status: DC

## 2020-10-21 NOTE — H&P (Signed)
Primary Care Physician:  Anabel Halon, MD Primary Gastroenterologist:  Dr. Marletta Lor  Pre-Procedure History & Physical: HPI:  Colleen Rowe is a 60 y.o. female is here for a colonoscopy for colon cancer screening purposes.  Patient denies any family history of colorectal cancer.  No melena or hematochezia.  No abdominal pain or unintentional weight loss.  No change in bowel habits.  Overall feels well from a GI standpoint.  Past Medical History:  Diagnosis Date   Allergies    Diabetes mellitus without complication (HCC)    Hypertension     Past Surgical History:  Procedure Laterality Date   CHOLECYSTECTOMY     DENTAL SURGERY      Prior to Admission medications   Medication Sig Start Date End Date Taking? Authorizing Provider  amLODipine (NORVASC) 5 MG tablet Take 1 tablet (5 mg total) by mouth daily. 09/05/20  Yes Anabel Halon, MD  Cholecalciferol (VITAMIN D3) 50 MCG (2000 UT) TABS Take 2,000 Units by mouth daily.   Yes [provider]  diphenhydrAMINE (BENADRYL) 25 MG tablet Take 25 mg by mouth daily as needed for allergies.   Yes [provider]  fexofenadine (ALLEGRA) 180 MG tablet Take 180 mg by mouth daily as needed for allergies or rhinitis.   Yes [provider]  metFORMIN (GLUCOPHAGE) 500 MG tablet Take 1 tablet (500 mg total) by mouth 2 (two) times daily with a meal. 09/11/20  Yes Patel, Earlie Lou, MD  olmesartan-hydrochlorothiazide (BENICAR HCT) 40-12.5 MG tablet Take 1 tablet by mouth daily.   Yes [provider]  Simethicone (GAS-X PO) Take 1 tablet by mouth daily as needed (gas).   Yes [provider]  vitamin B-12 (CYANOCOBALAMIN) 500 MCG tablet Take 500 mcg by mouth daily.   Yes [provider]  acetaminophen (TYLENOL) 500 MG tablet Take 500-1,000 mg by mouth every 6 (six) hours as needed for moderate pain.    [provider]  Calcium Carbonate Antacid (ALKA-SELTZER HEARTBURN PO) Take 2 tablets by mouth daily  as needed (heartburn).    [provider]  ferrous gluconate (FERGON) 240 (27 FE) MG tablet Take 480 mg by mouth daily.    [provider]  terbinafine (LAMISIL) 250 MG tablet Take 1 tablet (250 mg total) by mouth daily. 10/14/20   Anabel Halon, MD  Tetrahydrozoline HCl (VISINE OP) Place 1 drop into both eyes daily as needed (redness).    [provider]    Allergies as of 10/08/2020   (No Known Allergies)    Family History  Problem Relation Age of Onset   Breast cancer Mother    Diabetes Mother    Hypertension Mother    Breast cancer Sister    Colon cancer Neg Hx     Social History   Socioeconomic History   Marital status: Divorced    Spouse name: Not on file   Number of children: 2   Years of education: Not on file   Highest education level: Not on file  Occupational History   Not on file  Tobacco Use   Smoking status: Never   Smokeless tobacco: Never  Vaping Use   Vaping Use: Never used  Substance and Sexual Activity   Alcohol use: Not Currently   Drug use: Never   Sexual activity: Not Currently    Birth control/protection: Post-menopausal  Other Topics Concern   Not on file  Social History Narrative   Not on file   Social Determinants of  Health   Financial Resource Strain: Unknown   Difficulty of Paying Living Expenses: Patient refused  Food Insecurity: Unknown   Worried About Running Out of Food in the Last Year: Patient refused   Ran Out of Food in the Last Year: Patient refused  Transportation Needs: No Transportation Needs   Lack of Transportation (Medical): No   Lack of Transportation (Non-Medical): No  Physical Activity: Sufficiently Active   Days of Exercise per Week: 4 days   Minutes of Exercise per Session: 100 min  Stress: No Stress Concern Present   Feeling of Stress : Not at all  Social Connections: Unknown   Frequency of Communication with Friends and Family: Patient refused   Frequency of Social Gatherings  with Friends and Family: Patient refused   Attends Religious Services: Patient refused   Database administrator or Organizations: No   Attends Engineer, structural: Never   Marital Status: Divorced  Catering manager Violence: Not At Risk   Fear of Current or Ex-Partner: No   Emotionally Abused: No   Physically Abused: No   Sexually Abused: No    Review of Systems: See HPI, otherwise negative ROS  Physical Exam: Vital signs in last 24 hours: Temp:  [98 F (36.7 C)] 98 F (36.7 C) (10/18 0753) Pulse Rate:  [79] 79 (10/18 0753) Resp:  [17] 17 (10/18 0753) BP: (148)/(57) 148/57 (10/18 0756) SpO2:  [100 %] 100 % (10/18 0753) Weight:  [101.7 kg] 101.7 kg (10/18 0753)   General:   Alert,  Well-developed, well-nourished, pleasant and cooperative in NAD Head:  Normocephalic and atraumatic. Eyes:  Sclera clear, no icterus.   Conjunctiva pink. Ears:  Normal auditory acuity. Nose:  No deformity, discharge,  or lesions. Mouth:  No deformity or lesions, dentition normal. Neck:  Supple; no masses or thyromegaly. Lungs:  Clear throughout to auscultation.   No wheezes, crackles, or rhonchi. No acute distress. Heart:  Regular rate and rhythm; no murmurs, clicks, rubs,  or gallops. Abdomen:  Soft, nontender and nondistended. No masses, hepatosplenomegaly or hernias noted. Normal bowel sounds, without guarding, and without rebound.   Msk:  Symmetrical without gross deformities. Normal posture. Extremities:  Without clubbing or edema. Neurologic:  Alert and  oriented x4;  grossly normal neurologically. Skin:  Intact without significant lesions or rashes. Cervical Nodes:  No significant cervical adenopathy. Psych:  Alert and cooperative. Normal mood and affect.  Impression/Plan: Colleen Rowe is here for a colonoscopy to be performed for colon cancer screening purposes.  The risks of the procedure including infection, bleed, or perforation as well as benefits, limitations,  alternatives and imponderables have been reviewed with the patient. Questions have been answered. All parties agreeable.

## 2020-10-21 NOTE — Discharge Instructions (Addendum)

## 2020-10-21 NOTE — Anesthesia Preprocedure Evaluation (Signed)
Anesthesia Evaluation  Patient identified by MRN, date of birth, ID band Patient awake    Reviewed: Allergy & Precautions, H&P , NPO status , Patient's Chart, lab work & pertinent test results, reviewed documented beta blocker date and time   Airway Mallampati: II  TM Distance: >3 FB Neck ROM: full    Dental no notable dental hx.    Pulmonary sleep apnea ,    Pulmonary exam normal breath sounds clear to auscultation       Cardiovascular Exercise Tolerance: Good hypertension, negative cardio ROS   Rhythm:regular Rate:Normal     Neuro/Psych negative neurological ROS  negative psych ROS   GI/Hepatic negative GI ROS, Neg liver ROS,   Endo/Other  negative endocrine ROSdiabetes, Type 2  Renal/GU negative Renal ROS  negative genitourinary   Musculoskeletal   Abdominal   Peds  Hematology negative hematology ROS (+)   Anesthesia Other Findings   Reproductive/Obstetrics negative OB ROS                             Anesthesia Physical Anesthesia Plan  ASA: 2  Anesthesia Plan: General   Post-op Pain Management:    Induction:   PONV Risk Score and Plan: Propofol infusion  Airway Management Planned:   Additional Equipment:   Intra-op Plan:   Post-operative Plan:   Informed Consent: I have reviewed the patients History and Physical, chart, labs and discussed the procedure including the risks, benefits and alternatives for the proposed anesthesia with the patient or authorized representative who has indicated his/her understanding and acceptance.     Dental Advisory Given  Plan Discussed with: CRNA  Anesthesia Plan Comments:         Anesthesia Quick Evaluation

## 2020-10-21 NOTE — Op Note (Signed)
[facility] Patient Name: Colleen Rowe Procedure Date: 10/21/2020 8:29 AM MRN: [id number] Date of Birth: 1960-12-06 Attending MD: Elon Alas. Abbey Chatters DO CSN: [id number] Age: 60 Admit Type: Outpatient Procedure:                Colonoscopy Indications:              Screening for colorectal malignant neoplasm Providers:                Elon Alas. Abbey Chatters, DO, Jessica Boudreaux, Casimer Bilis, Technician Referring MD:              Medicines:                See the Anesthesia note for documentation of the                            administered medications Complications:            No immediate complications. Estimated Blood Loss:     Estimated blood loss: none. Procedure:                Pre-Anesthesia Assessment:                           - The anesthesia plan was to use monitored                            anesthesia care (MAC).                           After obtaining informed consent, the colonoscope                            was passed under direct vision. Throughout the                            procedure, the patient's blood pressure, pulse, and                            oxygen saturations were monitored continuously. The                            [id number]-HQ190L ([id number]) scope was introduced through                            the anus and advanced to the the cecum, identified                            by appendiceal orifice and ileocecal valve. The                            colonoscopy was technically difficult and complex                            due to a redundant colon and significant  looping.                            Successful completion of the procedure was aided by                            applying abdominal pressure. The patient tolerated                            the procedure well. The quality of the bowel                            preparation was evaluated using the BBPS [facility]                            Bowel  Preparation Scale) with scores of: Right                            Colon = 2 (minor amount of residual staining, small                            fragments of stool and/or opaque liquid, but mucosa                            seen well), Transverse Colon = 2 (minor amount of                            residual staining, small fragments of stool and/or                            opaque liquid, but mucosa seen well) and Left Colon                            = 2 (minor amount of residual staining, small                            fragments of stool and/or opaque liquid, but mucosa                            seen well). The total BBPS score equals 6. The                            quality of the bowel preparation was fair. Scope In: 8:43:15 AM Scope Out: 9:02:54 AM Scope Withdrawal Time: 0 hours 15 minutes 14 seconds  Total Procedure Duration: 0 hours 19 minutes 39 seconds  Findings:      Hemorrhoids were found on perianal exam.      Non-bleeding internal hemorrhoids were found during endoscopy.      The entire examined colon appeared normal. Impression:               - Preparation of the colon was fair.                           -  Hemorrhoids found on perianal exam.                           - Non-bleeding internal hemorrhoids.                           - The entire examined colon is normal.                           - No specimens collected. Moderate Sedation:      Per Anesthesia Care Recommendation:           - Patient has a contact number available for                            emergencies. The signs and symptoms of potential                            delayed complications were discussed with the                            patient. Return to normal activities tomorrow.                            Written discharge instructions were provided to the                            patient.                           - Resume previous diet.                           - Continue present  medications.                           - Repeat colonoscopy in 10 years for screening                            purposes.                           - Return to GI clinic PRN. Procedure Code(s):        --- Professional ---                           P5916, Colorectal cancer screening; colonoscopy on                            individual not meeting criteria for high risk Diagnosis Code(s):        --- Professional ---                           Z12.11, Encounter for screening for malignant                            neoplasm of colon  K64.8, Other hemorrhoids CPT copyright 2019 American [employer]. All rights reserved. The codes documented in this report are preliminary and upon coder review may  be revised to meet current compliance requirements. Elon Alas. Abbey Chatters, DO [place] Abbey Chatters, DO 10/21/2020 9:05:44 AM This report has been signed electronically. Number of Addenda: 0

## 2020-10-21 NOTE — Transfer of Care (Signed)
Immediate Anesthesia Transfer of Care Note  Patient: Colleen Rowe  Procedure(s) Performed: COLONOSCOPY WITH PROPOFOL  Patient Location: Endoscopy Unit  Anesthesia Type:General  Level of Consciousness: drowsy  Airway & Oxygen Therapy: Patient Spontanous Breathing  Post-op Assessment: Report given to RN and Post -op Vital signs reviewed and stable  Post vital signs: Reviewed and stable  Last Vitals:  Vitals Value Taken Time  BP    Temp    Pulse    Resp    SpO2      Last Pain:  Vitals:   10/21/20 0843  TempSrc:   PainSc: 0-No pain      Patients Stated Pain Goal: 7 (10/21/20 0753)  Complications: No notable events documented.

## 2020-10-21 NOTE — Anesthesia Postprocedure Evaluation (Signed)
Anesthesia Post Note  Patient: Colleen Rowe  Procedure(s) Performed: COLONOSCOPY WITH PROPOFOL  Patient location during evaluation: Phase II Anesthesia Type: General Level of consciousness: awake Pain management: pain level controlled Vital Signs Assessment: post-procedure vital signs reviewed and stable Respiratory status: spontaneous breathing and respiratory function stable Cardiovascular status: blood pressure returned to baseline and stable Postop Assessment: no headache and no apparent nausea or vomiting Anesthetic complications: no Comments: Late entry   No notable events documented.   Last Vitals:  Vitals:   10/21/20 0756 10/21/20 0906  BP: (!) 148/57 134/79  Pulse:  85  Resp:  (!) 21  Temp:  36.8 C  SpO2:  99%    Last Pain:  Vitals:   10/21/20 0906  TempSrc: Oral  PainSc: 0-No pain                 Windell Norfolk

## 2020-10-24 ENCOUNTER — Encounter (HOSPITAL_COMMUNITY): Payer: Self-pay | Admitting: Internal Medicine

## 2020-10-27 ENCOUNTER — Encounter: Payer: Self-pay | Admitting: Pulmonary Disease

## 2020-10-27 ENCOUNTER — Other Ambulatory Visit: Payer: Self-pay

## 2020-10-27 ENCOUNTER — Ambulatory Visit (INDEPENDENT_AMBULATORY_CARE_PROVIDER_SITE_OTHER): Payer: 59 | Admitting: Pulmonary Disease

## 2020-10-27 VITALS — BP 132/84 | HR 78 | Temp 98.2°F | Ht 68.0 in | Wt 224.2 lb

## 2020-10-27 DIAGNOSIS — R0683 Snoring: Secondary | ICD-10-CM | POA: Diagnosis not present

## 2020-10-27 NOTE — Patient Instructions (Signed)
Will arrange for home sleep study Will call to arrange for follow up after sleep study reviewed  

## 2020-10-27 NOTE — Progress Notes (Signed)
[facility]  Chief Complaint  Patient presents with   Consult    Ref by Dr. Allena Katz for potential sleep apnea. Pt says family tells her she stop breathing in sleep and snores.     Past Surgical History:  She  has a past surgical history that includes Dental surgery; Cholecystectomy; and Colonoscopy with propofol (N/A, 10/21/2020).  Past Medical History:  Allergies, DM, HTN  Constitutional:  BP 132/84   Pulse 78   Temp 98.2 F (36.8 C)   Ht 5\' 8"  (1.727 m)   Wt 224 lb 3.2 oz (101.7 kg)   SpO2 98%   BMI 34.09 kg/m   Brief Summary:  Colleen Rowe is a 60 y.o. female with snoring.      Subjective:   She had a sleep study several years ago, and was told she had sleep apnea.  She opted against therapy at that time.  Her mother has sleep apnea.  Her sleep has gotten worse over the years.  She is snoring more and stops breathing while asleep.  She can fall asleep while watching TV.  She works third shift.    She goes to sleep shortly after returning home from work.  She falls asleep after about 20 minutes.  She wakes up 1 or  times to use the bathroom.  She gets out of bed a few hours before she has to go to work.  She feels tired in the morning.  She denies morning headache.  She does not use anything to help her fall sleep.  She has tried drinking coffee to help stay awake.  She denies sleep walking, sleep talking, bruxism, or nightmares.  There is no history of restless legs.  She denies sleep hallucinations, sleep paralysis, or cataplexy.  The Epworth score is 4 out of 24.   Physical Exam:   Appearance - well kempt   ENMT - no sinus tenderness, no oral exudate, no LAN, Mallampati 4 airway, no stridor, scalloped tongue  Respiratory - equal breath sounds bilaterally, no wheezing or rales  CV - s1s2 regular rate and rhythm, no murmurs  Ext - no clubbing, no edema  Skin - no rashes  Psych - normal mood and affect   Sleep  Tests:    Social History:  She  reports that she has never smoked. She has never used smokeless tobacco. She reports that she does not currently use alcohol. She reports that she does not use drugs.  Family History:  Her family history includes Breast cancer in her mother and sister; Diabetes in her mother; Hypertension in her mother.    Discussion:  She has snoring, sleep disruption, apnea, and daytime sleepiness.  She has history of hypertension.  I am concerned she could have obstructive sleep apnea.  Assessment/Plan:   Snoring with excessive daytime sleepiness. - will need to arrange for a home sleep study  Obesity. - discussed how weight can impact sleep and risk for sleep disordered breathing - discussed options to assist with weight loss: combination of diet modification, cardiovascular and strength training exercises  Cardiovascular risk. - had an extensive discussion regarding the adverse health consequences related to untreated sleep disordered breathing - specifically discussed the risks for hypertension, coronary artery disease, cardiac dysrhythmias, cerebrovascular disease, and diabetes - lifestyle modification discussed  Safe driving practices. - discussed how sleep disruption can increase risk of accidents, particularly when driving - safe driving practices were discussed  Therapies for obstructive sleep  apnea. - if the sleep study shows significant sleep apnea, then various therapies for treatment were reviewed: CPAP, oral appliance, and surgical interventions  Time Spent Involved in Patient Care on Day of Examination:  31 minutes  Follow up:   Patient Instructions  Will arrange for home sleep study Will call to arrange for follow up after sleep study reviewed  Medication List:   Allergies as of 10/27/2020   No Known Allergies      Medication List        Accurate as of October 27, 2020 12:07 PM. If you have any questions, ask your nurse or doctor.           acetaminophen 500 MG tablet Commonly known as: TYLENOL Take 500-1,000 mg by mouth every 6 (six) hours as needed for moderate pain.   ALKA-SELTZER HEARTBURN PO Take 2 tablets by mouth daily as needed (heartburn).   amLODipine 5 MG tablet Commonly known as: NORVASC Take 1 tablet (5 mg total) by mouth daily.   diphenhydrAMINE 25 MG tablet Commonly known as: BENADRYL Take 25 mg by mouth daily as needed for allergies.   ferrous gluconate 240 (27 FE) MG tablet Commonly known as: FERGON Take 480 mg by mouth daily.   fexofenadine 180 MG tablet Commonly known as: ALLEGRA Take 180 mg by mouth daily as needed for allergies or rhinitis.   GAS-X PO Take 1 tablet by mouth daily as needed (gas).   metFORMIN 500 MG tablet Commonly known as: GLUCOPHAGE Take 1 tablet (500 mg total) by mouth 2 (two) times daily with a meal.   olmesartan-hydrochlorothiazide 40-12.5 MG tablet Commonly known as: BENICAR HCT Take 1 tablet by mouth daily.   terbinafine 250 MG tablet Commonly known as: LAMISIL Take 1 tablet (250 mg total) by mouth daily.   VISINE OP Place 1 drop into both eyes daily as needed (redness).   vitamin B-12 500 MCG tablet Commonly known as: CYANOCOBALAMIN Take 500 mcg by mouth daily.   Vitamin D3 50 MCG (2000 UT) Tabs Take 2,000 Units by mouth daily.        Signature:  Coralyn Helling, MD [facility] Pager - [phone] 10/27/2020, 12:07 PM

## 2020-11-04 ENCOUNTER — Ambulatory Visit (INDEPENDENT_AMBULATORY_CARE_PROVIDER_SITE_OTHER): Payer: 59 | Admitting: Internal Medicine

## 2020-11-04 ENCOUNTER — Other Ambulatory Visit: Payer: Self-pay

## 2020-11-04 ENCOUNTER — Encounter: Payer: Self-pay | Admitting: Internal Medicine

## 2020-11-04 VITALS — BP 144/80 | HR 72 | Resp 18 | Ht 68.0 in | Wt 228.0 lb

## 2020-11-04 DIAGNOSIS — E782 Mixed hyperlipidemia: Secondary | ICD-10-CM

## 2020-11-04 DIAGNOSIS — G4733 Obstructive sleep apnea (adult) (pediatric): Secondary | ICD-10-CM

## 2020-11-04 DIAGNOSIS — I10 Essential (primary) hypertension: Secondary | ICD-10-CM

## 2020-11-04 DIAGNOSIS — Z0001 Encounter for general adult medical examination with abnormal findings: Secondary | ICD-10-CM

## 2020-11-04 DIAGNOSIS — Z0100 Encounter for examination of eyes and vision without abnormal findings: Secondary | ICD-10-CM | POA: Diagnosis not present

## 2020-11-04 DIAGNOSIS — E119 Type 2 diabetes mellitus without complications: Secondary | ICD-10-CM | POA: Diagnosis not present

## 2020-11-04 MED ORDER — OLMESARTAN MEDOXOMIL-HCTZ 40-12.5 MG PO TABS: 1.0000 | ORAL_TABLET | Freq: Every day | ORAL | 0 refills | Status: DC

## 2020-11-04 MED ORDER — AMLODIPINE BESYLATE 10 MG PO TABS: 10.0000 mg | ORAL_TABLET | Freq: Every day | ORAL | 1 refills | Status: DC

## 2020-11-04 NOTE — Progress Notes (Signed)
Acute Office Visit  Subjective:    Patient ID: Colleen Rowe, female    DOB: 12-29-1960, 60 y.o.   MRN: [id number]  Chief Complaint  Patient presents with   Follow-up    6 week follow up HTN     HPI Patient is in today for f/u of HTN.  HTN: Her BP was better today compared to prior visit, but still elevated. She has been taking her Olmesartan-HCTZ and Amlodipine regularly. She denies any headache, dizziness, chest pain, dyspnea or palpitations.  OSA: Planned to get a home sleep study.  Past Medical History:  Diagnosis Date   Allergies    Diabetes mellitus without complication ([place])    Hypertension     Past Surgical History:  Procedure Laterality Date   CHOLECYSTECTOMY     COLONOSCOPY WITH PROPOFOL N/A 10/21/2020   Procedure: COLONOSCOPY WITH PROPOFOL;  Surgeon: Eloise Harman, DO;  [facility];  Service: Endoscopy;  Laterality: N/A;  9:00 / ASA II   DENTAL SURGERY      Family History  Problem Relation Age of Onset   Breast cancer Mother    Diabetes Mother    Hypertension Mother    Breast cancer Sister    Colon cancer Neg Hx     Social History   Socioeconomic History   Marital status: Divorced    Spouse name: Not on file   Number of children: 2   Years of education: Not on file   Highest education level: Not on file  Occupational History   Not on file  Tobacco Use   Smoking status: Never   Smokeless tobacco: Never  Vaping Use   Vaping Use: Never used  Substance and Sexual Activity   Alcohol use: Not Currently   Drug use: Never   Sexual activity: Not Currently    Birth control/protection: Post-menopausal  Other Topics Concern   Not on file  Social History Narrative   Not on file   Social Determinants of Health   Financial Resource Strain: Unknown   Difficulty of Paying Living Expenses: Patient refused  Food Insecurity: Unknown   Worried About Charity fundraiser in the Last Year: Patient refused   Ran Out of Food in the Last  Year: Patient refused  Transportation Needs: No Transportation Needs   Lack of Transportation (Medical): No   Lack of Transportation (Non-Medical): No  Physical Activity: Sufficiently Active   Days of Exercise per Week: 4 days   Minutes of Exercise per Session: 100 min  Stress: No Stress Concern Present   Feeling of Stress : Not at all  Social Connections: Unknown   Frequency of Communication with Friends and Family: Patient refused   Frequency of Social Gatherings with Friends and Family: Patient refused   Attends Religious Services: Patient refused   Marine scientist or Organizations: No   Attends Music therapist: Never   Marital Status: Divorced  Human resources officer Violence: Not At Risk   Fear of Current or Ex-Partner: No   Emotionally Abused: No   Physically Abused: No   Sexually Abused: No    Outpatient Medications Prior to Visit  Medication Sig Dispense Refill   acetaminophen (TYLENOL) 500 MG tablet Take 500-1,000 mg by mouth every 6 (six) hours as needed for moderate pain.     Calcium Carbonate Antacid (ALKA-SELTZER HEARTBURN PO) Take 2 tablets by mouth daily as needed (heartburn).     Cholecalciferol (VITAMIN D3) 50 MCG (2000 UT) TABS Take  2,000 Units by mouth daily.     diphenhydrAMINE (BENADRYL) 25 MG tablet Take 25 mg by mouth daily as needed for allergies.     ferrous gluconate (FERGON) 240 (27 FE) MG tablet Take 480 mg by mouth daily.     fexofenadine (ALLEGRA) 180 MG tablet Take 180 mg by mouth daily as needed for allergies or rhinitis.     metFORMIN (GLUCOPHAGE) 500 MG tablet Take 1 tablet (500 mg total) by mouth 2 (two) times daily with a meal. 180 tablet 1   Simethicone (GAS-X PO) Take 1 tablet by mouth daily as needed (gas).     terbinafine (LAMISIL) 250 MG tablet Take 1 tablet (250 mg total) by mouth daily. 90 tablet 0   Tetrahydrozoline HCl (VISINE OP) Place 1 drop into both eyes daily as needed (redness).     vitamin B-12 (CYANOCOBALAMIN) 500  MCG tablet Take 500 mcg by mouth daily.     amLODipine (NORVASC) 5 MG tablet Take 1 tablet (5 mg total) by mouth daily. 90 tablet 0   olmesartan-hydrochlorothiazide (BENICAR HCT) 40-12.5 MG tablet Take 1 tablet by mouth daily.     No facility-administered medications prior to visit.    No Known Allergies  Review of Systems  Constitutional:  Negative for chills and fever.  HENT:  Negative for congestion, sinus pressure, sinus pain and sore throat.   Eyes:  Negative for pain and discharge.  Respiratory:  Positive for apnea. Negative for cough and shortness of breath.   Cardiovascular:  Positive for leg swelling. Negative for chest pain and palpitations.  Gastrointestinal:  Negative for abdominal pain, constipation, diarrhea, nausea and vomiting.  Endocrine: Negative for polydipsia and polyuria.  Genitourinary:  Negative for dysuria and hematuria.  Musculoskeletal:  Negative for neck pain and neck stiffness.  Skin:  Positive for rash.  Neurological:  Negative for dizziness and weakness.  Psychiatric/Behavioral:  Negative for agitation and behavioral problems.       Objective:    Physical Exam Vitals reviewed.  Constitutional:      General: She is not in acute distress.    Appearance: She is not diaphoretic.  HENT:     Head: Normocephalic and atraumatic.     Nose: Nose normal.     Mouth/Throat:     Mouth: Mucous membranes are moist.  Eyes:     General: No scleral icterus.    Extraocular Movements: Extraocular movements intact.  Cardiovascular:     Rate and Rhythm: Normal rate and regular rhythm.     Pulses: Normal pulses.     Heart sounds: Normal heart sounds. No murmur heard. Pulmonary:     Breath sounds: Normal breath sounds. No wheezing or rales.  Musculoskeletal:     Cervical back: Neck supple. No tenderness.     Right lower leg: No edema.     Left lower leg: No edema.  Feet:     Left foot:     Skin integrity: Dry skin present.     Toenail Condition: Left toenails  are abnormally thick. Fungal disease present. Skin:    General: Skin is warm.     Findings: Rash (Whitish patch over dorsum of left foot and in-between the toes) present.  Neurological:     General: No focal deficit present.     Mental Status: She is alert and oriented to person, place, and time.     Sensory: No sensory deficit.  Psychiatric:        Mood and Affect: Mood normal.  Behavior: Behavior normal.    BP (!) 144/80 (BP Location: Left Arm, Cuff Size: Normal)   Pulse 72   Resp 18   Ht '5\' 8"'  (1.727 m)   Wt 228 lb (103.4 kg)   SpO2 98%   BMI 34.67 kg/m  Wt Readings from Last 3 Encounters:  11/04/20 228 lb (103.4 kg)  10/27/20 224 lb 3.2 oz (101.7 kg)  10/21/20 224 lb 1.9 oz (101.7 kg)    Health Maintenance Due  Topic Date Due   COVID-19 Vaccine (1) Never done   Pneumococcal Vaccine 45-51 Years old (1 - PCV) Never done   FOOT EXAM  Never done   OPHTHALMOLOGY EXAM  Never done   Zoster Vaccines- Shingrix (1 of 2) Never done   INFLUENZA VACCINE  Never done    There are no preventive care reminders to display for this patient.   Lab Results  Component Value Date   TSH 1.920 09/09/2020   Lab Results  Component Value Date   WBC 9.5 09/09/2020   HGB 12.8 09/09/2020   HCT 39.6 09/09/2020   MCV 83 09/09/2020   PLT 235 09/09/2020   Lab Results  Component Value Date   NA 138 09/09/2020   K 4.2 09/09/2020   CO2 23 09/09/2020   GLUCOSE 96 09/09/2020   BUN 19 09/09/2020   CREATININE 1.03 (H) 09/09/2020   BILITOT 0.2 09/09/2020   ALKPHOS 140 (H) 09/09/2020   AST 52 (H) 09/09/2020   ALT 48 (H) 09/09/2020   PROT 7.5 09/09/2020   ALBUMIN 4.2 09/09/2020   CALCIUM 9.6 09/09/2020   ANIONGAP 9 03/28/2014   EGFR 62 09/09/2020   Lab Results  Component Value Date   CHOL 154 09/09/2020   Lab Results  Component Value Date   HDL 46 09/09/2020   Lab Results  Component Value Date   LDLCALC 95 09/09/2020   Lab Results  Component Value Date   TRIG 64  09/09/2020   Lab Results  Component Value Date   CHOLHDL 3.3 09/09/2020   Lab Results  Component Value Date   HGBA1C 8.2 (H) 09/09/2020       Assessment & Plan:   Problem List Items Addressed This Visit       Cardiovascular and Mediastinum   Essential hypertension - Primary    BP Readings from Last 1 Encounters:  11/04/20 (!) 144/80  Uncontrolled with Olmesartan-HCTZ  and Amlodipine 5 mg QD Increased Amlodipine dose to 10 mg QD Appears to be a component of OSA as well Counseled for compliance with the medications Advised DASH diet and moderate exercise/walking, at least 150 mins/week       Relevant Medications   olmesartan-hydrochlorothiazide (BENICAR HCT) 40-12.5 MG tablet   amLODipine (NORVASC) 10 MG tablet   Other Relevant Orders   CMP14+EGFR     Respiratory   OSA (obstructive sleep apnea)    STOP-BANG: 6 High risk for OSA Had referred to Pulmonology for sleep study        Endocrine   Type 2 diabetes mellitus without complication, without long-term current use of insulin ([facility])   Relevant Medications   olmesartan-hydrochlorothiazide (BENICAR HCT) 40-12.5 MG tablet   Other Relevant Orders   HgB A1c   Other Visit Diagnoses     Diabetic eye exam ([place])       Relevant Medications   olmesartan-hydrochlorothiazide (BENICAR HCT) 40-12.5 MG tablet   Other Relevant Orders   Ambulatory referral to Ophthalmology   Mixed hyperlipidemia  Relevant Medications   olmesartan-hydrochlorothiazide (BENICAR HCT) 40-12.5 MG tablet   amLODipine (NORVASC) 10 MG tablet   Other Relevant Orders   Lipid panel        Meds ordered this encounter  Medications   olmesartan-hydrochlorothiazide (BENICAR HCT) 40-12.5 MG tablet    Sig: Take 1 tablet by mouth daily.    Dispense:  90 tablet    Refill:  0   amLODipine (NORVASC) 10 MG tablet    Sig: Take 1 tablet (10 mg total) by mouth daily.    Dispense:  90 tablet    Refill:  1     Nahomy [place] Keith Rake, MD

## 2020-11-04 NOTE — Patient Instructions (Signed)
Please start taking Amlodipine 10 mg instead of 5 mg. Okay to take 2 tablets of Amlodipine 5 mg until you complete current supply.  Please continue to take other medications as prescribed.  Please continue to follow DASH diet and perform moderate exercise/walking at least 150 mins/week.

## 2020-11-04 NOTE — Assessment & Plan Note (Signed)
BP Readings from Last 1 Encounters:  11/04/20 (!) 144/80   Uncontrolled with Olmesartan-HCTZ  and Amlodipine 5 mg QD Increased Amlodipine dose to 10 mg QD Appears to be a component of OSA as well Counseled for compliance with the medications Advised DASH diet and moderate exercise/walking, at least 150 mins/week

## 2020-11-04 NOTE — Assessment & Plan Note (Addendum)
STOP-BANG: 6 High risk for OSA Had referred to Pulmonology for sleep study

## 2020-11-15 ENCOUNTER — Other Ambulatory Visit: Payer: Self-pay | Admitting: Internal Medicine

## 2020-11-15 DIAGNOSIS — I10 Essential (primary) hypertension: Secondary | ICD-10-CM

## 2020-12-08 ENCOUNTER — Other Ambulatory Visit: Payer: Self-pay | Admitting: Internal Medicine

## 2020-12-08 DIAGNOSIS — I10 Essential (primary) hypertension: Secondary | ICD-10-CM

## 2021-02-04 ENCOUNTER — Telehealth: Payer: Self-pay | Admitting: Internal Medicine

## 2021-02-04 NOTE — Telephone Encounter (Signed)
Pt called in regard to Pulmonary referral   Pt states that she would like a referral to another office

## 2021-02-04 NOTE — Telephone Encounter (Signed)
Spoke with pt she doesn't want to see the same dr but not sure who she saw last she will call back and think on it to see if she would just like to change providers in that office or move all together

## 2021-02-20 ENCOUNTER — Other Ambulatory Visit: Payer: Self-pay | Admitting: Internal Medicine

## 2021-02-20 DIAGNOSIS — I10 Essential (primary) hypertension: Secondary | ICD-10-CM

## 2021-03-10 ENCOUNTER — Ambulatory Visit: Payer: 59 | Admitting: Internal Medicine

## 2021-03-30 ENCOUNTER — Other Ambulatory Visit: Payer: Self-pay

## 2021-03-30 ENCOUNTER — Encounter: Payer: Self-pay | Admitting: Internal Medicine

## 2021-03-30 ENCOUNTER — Ambulatory Visit (INDEPENDENT_AMBULATORY_CARE_PROVIDER_SITE_OTHER): Payer: 59 | Admitting: Internal Medicine

## 2021-03-30 VITALS — BP 156/86 | HR 77 | Ht 67.0 in | Wt 237.6 lb

## 2021-03-30 DIAGNOSIS — I10 Essential (primary) hypertension: Secondary | ICD-10-CM

## 2021-03-30 DIAGNOSIS — E119 Type 2 diabetes mellitus without complications: Secondary | ICD-10-CM | POA: Diagnosis not present

## 2021-03-30 DIAGNOSIS — G4733 Obstructive sleep apnea (adult) (pediatric): Secondary | ICD-10-CM

## 2021-03-30 MED ORDER — AMLODIPINE BESYLATE 10 MG PO TABS: 10.0000 mg | ORAL_TABLET | Freq: Every day | ORAL | 1 refills | Status: DC

## 2021-03-30 MED ORDER — OLMESARTAN MEDOXOMIL-HCTZ 40-12.5 MG PO TABS: 1.0000 | ORAL_TABLET | Freq: Every day | ORAL | 1 refills | Status: DC

## 2021-03-30 MED ORDER — METFORMIN HCL 500 MG PO TABS: 500.0000 mg | ORAL_TABLET | Freq: Two times a day (BID) | ORAL | 1 refills | Status: DC

## 2021-03-30 NOTE — Assessment & Plan Note (Signed)
STOP-BANG: 6 ?High risk for OSA ?Had referred to Pulmonology for sleep study - has not had sleep study yet ?

## 2021-03-30 NOTE — Patient Instructions (Signed)
Please start taking medications as prescribed. ° °Please continue to follow low carb diet and perform moderate exercise/walking at least 150 mins/week. °

## 2021-03-30 NOTE — Assessment & Plan Note (Signed)
Lab Results  ?Component Value Date  ? HGBA1C 7.7 (H) 03/25/2021  ? ? ?Was started on metformin, but has not been taking it - needs to be compliant ?Advised to follow diabetic diet ?Once she is compliant to current regimen, will start statin On ARB ?F/u CMP and lipid panel ?Diabetic eye exam: Advised to follow up with Ophthalmology for diabetic eye exam ?

## 2021-03-30 NOTE — Progress Notes (Signed)
? ?Established Patient Office Visit ? ?Subjective:  ?Patient ID: Colleen Rowe, female    DOB: May 17, 1960  Age: 61 y.o. MRN: [id number] ? ?CC:  ?Chief Complaint  ?Patient presents with  ? Follow-up  ? ? ?HPI ?Colleen Rowe is a 61 y.o. female with past medical history of HTN, type II DM and morbid obesity who presents for f/u of her chronic medical conditions. ? ?HTN: Her BP was elevated in the office today.  Of note, she has stopped taking her medications as she was confused about the dose of it.  Her pharmacy had dispensed amlodipine 5 mg instead of updated 10 mg dose.  I have highlighted her medications in her AVS and have counseled for compliance today.  She currently denies any headache, dizziness, chest pain, dyspnea or palpitations. ? ?Type II DM: Her HbA1c is still elevated at 7.7.  She has not been taking metformin.  She denies any polyuria or polydipsia currently. ? ?She is still has not had sleep study done. ? ?Her toenail infection has been better with Lamisil now. ? ? ? ? ?Past Medical History:  ?Diagnosis Date  ? Allergies   ? Diabetes mellitus without complication ([place])   ? Hypertension   ? ? ?Past Surgical History:  ?Procedure Laterality Date  ? CHOLECYSTECTOMY    ? COLONOSCOPY WITH PROPOFOL N/A 10/21/2020  ? Procedure: COLONOSCOPY WITH PROPOFOL;  Surgeon: Eloise Harman, DO;  [facility];  Service: Endoscopy;  Laterality: N/A;  9:00 / ASA II  ? DENTAL SURGERY    ? ? ?Family History  ?Problem Relation Age of Onset  ? Breast cancer Mother   ? Diabetes Mother   ? Hypertension Mother   ? Breast cancer Sister   ? Colon cancer Neg Hx   ? ? ?Social History  ? ?Socioeconomic History  ? Marital status: Divorced  ?  Spouse name: Not on file  ? Number of children: 2  ? Years of education: Not on file  ? Highest education level: Not on file  ?Occupational History  ? Not on file  ?Tobacco Use  ? Smoking status: Never  ? Smokeless tobacco: Never  ?Vaping Use  ? Vaping Use: Never used  ?Substance  and Sexual Activity  ? Alcohol use: Not Currently  ? Drug use: Never  ? Sexual activity: Not Currently  ?  Birth control/protection: Post-menopausal  ?Other Topics Concern  ? Not on file  ?Social History Narrative  ? Not on file  ? ?Social Determinants of Health  ? ?Financial Resource Strain: Unknown  ? Difficulty of Paying Living Expenses: Patient refused  ?Food Insecurity: Unknown  ? Worried About Charity fundraiser in the Last Year: Patient refused  ? Ran Out of Food in the Last Year: Patient refused  ?Transportation Needs: No Transportation Needs  ? Lack of Transportation (Medical): No  ? Lack of Transportation (Non-Medical): No  ?Physical Activity: Sufficiently Active  ? Days of Exercise per Week: 4 days  ? Minutes of Exercise per Session: 100 min  ?Stress: No Stress Concern Present  ? Feeling of Stress : Not at all  ?Social Connections: Unknown  ? Frequency of Communication with Friends and Family: Patient refused  ? Frequency of Social Gatherings with Friends and Family: Patient refused  ? Attends Religious Services: Patient refused  ? Active Member of Clubs or Organizations: No  ? Attends Archivist Meetings: Never  ? Marital Status: Divorced  ?Intimate Partner Violence: Not At Risk  ?  Fear of Current or Ex-Partner: No  ? Emotionally Abused: No  ? Physically Abused: No  ? Sexually Abused: No  ? ? ?Outpatient Medications Prior to Visit  ?Medication Sig Dispense Refill  ? acetaminophen (TYLENOL) 500 MG tablet Take 500-1,000 mg by mouth every 6 (six) hours as needed for moderate pain.    ? Calcium Carbonate Antacid (ALKA-SELTZER HEARTBURN PO) Take 2 tablets by mouth daily as needed (heartburn).    ? Cholecalciferol (VITAMIN D3) 50 MCG (2000 UT) TABS Take 2,000 Units by mouth daily.    ? diphenhydrAMINE (BENADRYL) 25 MG tablet Take 25 mg by mouth daily as needed for allergies.    ? ferrous gluconate (FERGON) 240 (27 FE) MG tablet Take 480 mg by mouth daily.    ? fexofenadine (ALLEGRA) 180 MG tablet  Take 180 mg by mouth daily as needed for allergies or rhinitis.    ? Simethicone (GAS-X PO) Take 1 tablet by mouth daily as needed (gas).    ? Tetrahydrozoline HCl (VISINE OP) Place 1 drop into both eyes daily as needed (redness).    ? vitamin B-12 (CYANOCOBALAMIN) 500 MCG tablet Take 500 mcg by mouth daily.    ? amLODipine (NORVASC) 10 MG tablet Take 1 tablet (10 mg total) by mouth daily. 90 tablet 1  ? metFORMIN (GLUCOPHAGE) 500 MG tablet Take 1 tablet (500 mg total) by mouth 2 (two) times daily with a meal. 180 tablet 1  ? olmesartan-hydrochlorothiazide (BENICAR HCT) 40-12.5 MG tablet TAKE 1 TABLET BY MOUTH DAILY 90 tablet 0  ? terbinafine (LAMISIL) 250 MG tablet Take 1 tablet (250 mg total) by mouth daily. 90 tablet 0  ? ?No facility-administered medications prior to visit.  ? ? ?No Known Allergies ? ?ROS ?Review of Systems  ?Constitutional:  Negative for chills and fever.  ?HENT:  Negative for congestion, sinus pressure, sinus pain and sore throat.   ?Eyes:  Negative for pain and discharge.  ?Respiratory:  Positive for apnea. Negative for cough and shortness of breath.   ?Cardiovascular:  Positive for leg swelling. Negative for chest pain and palpitations.  ?Gastrointestinal:  Negative for abdominal pain, constipation, diarrhea, nausea and vomiting.  ?Endocrine: Negative for polydipsia and polyuria.  ?Genitourinary:  Negative for dysuria and hematuria.  ?Musculoskeletal:  Negative for neck pain and neck stiffness.  ?Skin:  Negative for rash.  ?Neurological:  Negative for dizziness and weakness.  ?Psychiatric/Behavioral:  Negative for agitation and behavioral problems.   ? ?  ?Objective:  ?  ?Physical Exam ?Vitals reviewed.  ?Constitutional:   ?   General: She is not in acute distress. ?   Appearance: She is not diaphoretic.  ?HENT:  ?   Head: Normocephalic and atraumatic.  ?   Nose: Nose normal.  ?   Mouth/Throat:  ?   Mouth: Mucous membranes are moist.  ?Eyes:  ?   General: No scleral icterus. ?   Extraocular  Movements: Extraocular movements intact.  ?Cardiovascular:  ?   Rate and Rhythm: Normal rate and regular rhythm.  ?   Pulses: Normal pulses.  ?   Heart sounds: Normal heart sounds. No murmur heard. ?Pulmonary:  ?   Breath sounds: Normal breath sounds. No wheezing or rales.  ?Musculoskeletal:  ?   Cervical back: Neck supple. No tenderness.  ?   Right lower leg: No edema.  ?   Left lower leg: No edema.  ?Feet:  ?   Left foot:  ?   Skin integrity: Dry skin present.  ?Skin: ?  General: Skin is warm.  ?   Findings: Rash present.  ?Neurological:  ?   General: No focal deficit present.  ?   Mental Status: She is alert and oriented to person, place, and time.  ?   Sensory: No sensory deficit.  ?Psychiatric:     ?   Mood and Affect: Mood normal.     ?   Behavior: Behavior normal.  ? ? ?BP (!) 156/86   Pulse 77   Ht _0  (1.702 m)   Wt 237 lb 9.6 oz (107.8 kg)   SpO2 96%   BMI 37.21 kg/m?  ?Wt Readings from Last 3 Encounters:  ?03/30/21 237 lb 9.6 oz (107.8 kg)  ?11/04/20 228 lb (103.4 kg)  ?10/27/20 224 lb 3.2 oz (101.7 kg)  ? ? ?Lab Results  ?Component Value Date  ? TSH 1.920 09/09/2020  ? ?Lab Results  ?Component Value Date  ? WBC 9.5 09/09/2020  ? HGB 12.8 09/09/2020  ? HCT 39.6 09/09/2020  ? MCV 83 09/09/2020  ? PLT 235 09/09/2020  ? ?Lab Results  ?Component Value Date  ? NA 141 03/25/2021  ? K 3.9 03/25/2021  ? CO2 24 03/25/2021  ? GLUCOSE 162 (H) 03/25/2021  ? BUN 9 03/25/2021  ? CREATININE 0.94 03/25/2021  ? BILITOT <0.2 03/25/2021  ? ALKPHOS 115 03/25/2021  ? AST 35 03/25/2021  ? ALT 37 (H) 03/25/2021  ? PROT 7.2 03/25/2021  ? ALBUMIN 4.2 03/25/2021  ? CALCIUM WILL FOLLOW 03/25/2021  ? ANIONGAP 9 03/28/2014  ? EGFR 69 03/25/2021  ? ?Lab Results  ?Component Value Date  ? CHOL 131 03/25/2021  ? ?Lab Results  ?Component Value Date  ? HDL 41 03/25/2021  ? ?Lab Results  ?Component Value Date  ? [place] 65 03/25/2021  ? ?Lab Results  ?Component Value Date  ? TRIG 142 03/25/2021  ? ?Lab Results  ?Component Value Date   ? CHOLHDL 3.2 03/25/2021  ? ?Lab Results  ?Component Value Date  ? HGBA1C 7.7 (H) 03/25/2021  ? ? ?  ?Assessment & Plan:  ? ?Problem List Items Addressed This Visit   ? ?  ? Cardiovascular and Mediastinum

## 2021-03-30 NOTE — Assessment & Plan Note (Signed)
BP Readings from Last 1 Encounters:  ?03/30/21 (!) 156/86  ? ?Uncontrolled due to noncompliance ?Needs to be taking Olmesartan-HCTZ  and Amlodipine 10 mg QD, pharmacy gave her old 5 mg dose and she got confused and stopped taking both ?Appears to be a component of OSA as well ?Counseled for compliance with the medications ?Advised DASH diet and moderate exercise/walking, at least 150 mins/week ? ?

## 2021-04-01 LAB — CMP14+EGFR
ALT: 37 IU/L — ABNORMAL HIGH (ref 0–32)
AST: 35 IU/L (ref 0–40)
Albumin/Globulin Ratio: 1.4 (ref 1.2–2.2)
Albumin: 4.2 g/dL (ref 3.8–4.8)
Alkaline Phosphatase: 115 IU/L (ref 44–121)
BUN/Creatinine Ratio: 10 — ABNORMAL LOW (ref 12–28)
BUN: 9 mg/dL (ref 8–27)
Bilirubin Total: <0.2 mg/dL (ref 0.0–1.2)
CO2: 24 mmol/L (ref 20–29)
Calcium: 9 mg/dL (ref 8.7–10.3)
Chloride: 103 mmol/L (ref 96–106)
Creatinine, Ser: 0.94 mg/dL (ref 0.57–1.00)
Globulin, Total: 3 g/dL (ref 1.5–4.5)
Glucose: 162 mg/dL — ABNORMAL HIGH (ref 70–99)
Potassium: 3.9 mmol/L (ref 3.5–5.2)
Sodium: 141 mmol/L (ref 134–144)
Total Protein: 7.2 g/dL (ref 6.0–8.5)
eGFR: 69 mL/min/{1.73_m2} (ref >59)

## 2021-04-01 LAB — LIPID PANEL
Chol/HDL Ratio: 3.2 ratio (ref 0.0–4.4)
Cholesterol, Total: 131 mg/dL (ref 100–199)
HDL: 41 mg/dL (ref >39)
LDL Chol Calc (NIH): 65 mg/dL (ref 0–99)
Triglycerides: 142 mg/dL (ref 0–149)
VLDL Cholesterol Cal: 25 mg/dL (ref 5–40)

## 2021-04-01 LAB — HEMOGLOBIN A1C
Est. average glucose Bld gHb Est-mCnc: 174 mg/dL
Hgb A1c MFr Bld: 7.7 % — ABNORMAL HIGH (ref 4.8–5.6)

## 2021-04-03 ENCOUNTER — Telehealth: Payer: Self-pay | Admitting: Internal Medicine

## 2021-04-03 NOTE — Telephone Encounter (Signed)
Patient called in regard to pulmonary referral  ? ?Patient states that she has not heard from office and wants new referral  ?

## 2021-04-06 NOTE — Telephone Encounter (Signed)
Please have her contact labuer pulmonary.  ?She has sleep study scheduled with them 4/12 ?

## 2021-04-07 NOTE — Telephone Encounter (Signed)
Patient spoke with pulmonary  ?

## 2021-04-15 ENCOUNTER — Ambulatory Visit: Payer: 59

## 2021-04-15 DIAGNOSIS — R0683 Snoring: Secondary | ICD-10-CM

## 2021-04-15 DIAGNOSIS — G4733 Obstructive sleep apnea (adult) (pediatric): Secondary | ICD-10-CM

## 2021-04-23 ENCOUNTER — Telehealth: Payer: Self-pay | Admitting: Pulmonary Disease

## 2021-04-23 DIAGNOSIS — G4733 Obstructive sleep apnea (adult) (pediatric): Secondary | ICD-10-CM | POA: Diagnosis not present

## 2021-04-23 NOTE — Telephone Encounter (Signed)
HST 04/20/21 >> AHI 49.5, SpO2 low 55% ? ?Please inform her that her sleep study shows severe obstructive sleep apnea.  Please arrange for ROV with me or NP to discuss treatment options. ? ? ? ?

## 2021-04-23 NOTE — Telephone Encounter (Signed)
ATC patient. No answer and vm is full  ?

## 2021-04-24 NOTE — Telephone Encounter (Signed)
Spoke with patient  regarding HST results. They verbalized understanding. No further questions. Scheduled an appt with Roxan Diesel NP in [place] office. Patient was given address to [place] office ?Nothing further needed at this time.  ?

## 2021-04-30 ENCOUNTER — Encounter: Payer: Self-pay | Admitting: Nurse Practitioner

## 2021-04-30 ENCOUNTER — Ambulatory Visit (INDEPENDENT_AMBULATORY_CARE_PROVIDER_SITE_OTHER): Payer: 59 | Admitting: Nurse Practitioner

## 2021-04-30 VITALS — BP 148/84 | HR 85 | Temp 98.3°F | Ht 67.0 in | Wt 237.2 lb

## 2021-04-30 DIAGNOSIS — G4733 Obstructive sleep apnea (adult) (pediatric): Secondary | ICD-10-CM | POA: Diagnosis not present

## 2021-04-30 DIAGNOSIS — Z6837 Body mass index (BMI) 37.0-37.9, adult: Secondary | ICD-10-CM

## 2021-04-30 DIAGNOSIS — E669 Obesity, unspecified: Secondary | ICD-10-CM | POA: Insufficient documentation

## 2021-04-30 NOTE — Progress Notes (Signed)
? ?@Patient  ID: Colleen Rowe, female    DOB: September 06, 1960, 61 y.o.   MRN: [id number] ? ?Chief Complaint  ?Patient presents with  ? Follow-up  ? ? ?Referring provider: ?Anabel HalonPatel, Rutwik K, MD ? ?HPI: ?61 year old female, never smoker followed for severe obstructive sleep apnea. She is a patient of Dr. Evlyn CourierSood's and last seen in office on 10/27/2020. Past medical history significant for HTN, DM, seasonal allergies, obesity.   ? ?TEST/EVENTS:  ?04/20/2021 HST: AHI 49.5, SpO2 low 55% ? ?10/27/2020: OV with Dr. Craige CottaSood for sleep consult. Previously had a sleep study several years ago and told she had sleep apnea; opted against therapy at that time. Reported snoring more and stopping breathing when she sleeps. Works third shift. Falls asleep easily but wakes feeling tired. HST ordered. ? ?04/30/2021: Today - follow up ?Patient presents today to discuss home sleep study results which showed severe OSA and significant oxygen desaturations. She works night shift from 7p-7a. On her off days, she still stays up at night and usually sleeps during the day. Wakes feeling tired and uses coffee to keep her up. She denies headaches upon waking, drowsy driving or narcolepsy. She is currently trying to lose weight and working towards a more healthy lifestyle.  ? ?No Known Allergies ? ?Immunization History  ?Administered Date(s) Administered  ? PPD Test 09/19/2020  ? Tdap 09/05/2020  ? ? ?Past Medical History:  ?Diagnosis Date  ? Allergies   ? Diabetes mellitus without complication (HCC)   ? Hypertension   ? ? ?Tobacco History: ?Social History  ? ?Tobacco Use  ?Smoking Status Never  ?Smokeless Tobacco Never  ? ?Counseling given: Not Answered ? ? ?Outpatient Medications Prior to Visit  ?Medication Sig Dispense Refill  ? amLODipine (NORVASC) 10 MG tablet Take 1 tablet (10 mg total) by mouth daily. 90 tablet 1  ? Cholecalciferol (VITAMIN D3) 50 MCG (2000 UT) TABS Take 2,000 Units by mouth daily.    ? ferrous gluconate (FERGON) 240 (27 FE) MG tablet  Take 480 mg by mouth daily.    ? metFORMIN (GLUCOPHAGE) 500 MG tablet Take 1 tablet (500 mg total) by mouth 2 (two) times daily with a meal. 180 tablet 1  ? olmesartan-hydrochlorothiazide (BENICAR HCT) 40-12.5 MG tablet Take 1 tablet by mouth daily. 90 tablet 1  ? vitamin B-12 (CYANOCOBALAMIN) 500 MCG tablet Take 500 mcg by mouth daily.    ? acetaminophen (TYLENOL) 500 MG tablet Take 500-1,000 mg by mouth every 6 (six) hours as needed for moderate pain. (Patient not taking: Reported on 04/30/2021)    ? Calcium Carbonate Antacid (ALKA-SELTZER HEARTBURN PO) Take 2 tablets by mouth daily as needed (heartburn). (Patient not taking: Reported on 04/30/2021)    ? diphenhydrAMINE (BENADRYL) 25 MG tablet Take 25 mg by mouth daily as needed for allergies. (Patient not taking: Reported on 04/30/2021)    ? fexofenadine (ALLEGRA) 180 MG tablet Take 180 mg by mouth daily as needed for allergies or rhinitis. (Patient not taking: Reported on 04/30/2021)    ? Simethicone (GAS-X PO) Take 1 tablet by mouth daily as needed (gas). (Patient not taking: Reported on 04/30/2021)    ? Tetrahydrozoline HCl (VISINE OP) Place 1 drop into both eyes daily as needed (redness). (Patient not taking: Reported on 04/30/2021)    ? ?No facility-administered medications prior to visit.  ? ? ? ?Review of Systems:  ? ?Constitutional: No weight loss or gain, night sweats, fevers, chills. +fatigue ?HEENT: No headaches, difficulty swallowing, tooth/dental problems, or  sore throat. No sneezing, itching, ear ache, nasal congestion, or post nasal drip. +snoring ?CV:  No chest pain, orthopnea, PND, swelling in lower extremities, anasarca, dizziness, palpitations, syncope ?Resp: No shortness of breath with exertion or at rest. No excess mucus or change in color of mucus. No productive or non-productive. No hemoptysis. No wheezing.  No chest wall deformity ?Skin: No rash, lesions, ulcerations ?MSK:  No joint pain or swelling.  No decreased range of motion.  No back  pain. ?Neuro: No dizziness or lightheadedness.  ?Psych: No depression or anxiety. Mood stable.  ? ? ? ?Physical Exam: ? ?BP (!) 148/84 (BP Location: Right Arm, Patient Position: Sitting, Cuff Size: Normal)   Pulse 85   Temp 98.3 ?F (36.8 ?C) (Oral)   Ht 5\' 7"  (1.702 m)   Wt 237 lb 3.2 oz (107.6 kg)   SpO2 99%   BMI 37.15 kg/m?  ? ?GEN: Pleasant, interactive, well-appearing; obese; in no acute distress. ?HEENT:  Normocephalic and atraumatic. PERRLA. Sclera white. Nasal turbinates pink, moist and patent bilaterally. No rhinorrhea present. Oropharynx pink and moist, without exudate or edema. No lesions, ulcerations, or postnasal drip.  ?NECK:  Supple w/ fair ROM. No JVD present. Normal carotid impulses w/o bruits. Thyroid symmetrical with no goiter or nodules palpated. No lymphadenopathy.   ?CV: RRR, no m/r/g, no peripheral edema. Pulses intact, +2 bilaterally. No cyanosis, pallor or clubbing. ?PULMONARY:  Unlabored, regular breathing. Clear bilaterally A&P w/o wheezes/rales/rhonchi. No accessory muscle use. No dullness to percussion. ?GI: BS present and normoactive. Soft, non-tender to palpation.  ?MSK: No erythema, warmth or tenderness. Cap refil <2 sec all extrem. No deformities or joint swelling noted.  ?Neuro: A/Ox3. No focal deficits noted.   ?Skin: Warm, no lesions or rashe ?Psych: Normal affect and behavior. Judgement and thought content appropriate.  ? ? ? ?Lab Results: ? ?CBC ?   ?Component Value Date/Time  ? WBC 9.5 09/09/2020 0811  ? WBC 9.0 03/28/2014 1722  ? RBC 4.77 09/09/2020 0811  ? RBC 4.63 03/28/2014 1722  ? HGB 12.8 09/09/2020 0811  ? HCT 39.6 09/09/2020 0811  ? PLT 235 09/09/2020 0811  ? MCV 83 09/09/2020 0811  ? MCH 26.8 09/09/2020 0811  ? MCH 26.8 03/28/2014 1722  ? MCHC 32.3 09/09/2020 0811  ? MCHC 32.2 03/28/2014 1722  ? RDW 12.5 09/09/2020 0811  ? LYMPHSABS 3.9 (H) 09/09/2020 11/09/2020  ? MONOABS 0.5 03/28/2014 1722  ? EOSABS 0.3 09/09/2020 0811  ? BASOSABS 0.0 09/09/2020 0811  ? ? ?BMET ?    ?Component Value Date/Time  ? NA 141 03/25/2021 0913  ? K 3.9 03/25/2021 0913  ? CL 103 03/25/2021 0913  ? CO2 24 03/25/2021 0913  ? GLUCOSE 162 (H) 03/25/2021 0913  ? GLUCOSE 179 (H) 03/28/2014 1722  ? BUN 9 03/25/2021 0913  ? CREATININE 0.94 03/25/2021 0913  ? CALCIUM 9.0 03/25/2021 0913  ? GFRNONAA 68 (L) 03/28/2014 1722  ? GFRAA 79 (L) 03/28/2014 1722  ? ? ?BNP ?No results found for: BNP ? ? ?Imaging: ? ?No results found. ? ? ? ?   ? View : No data to display.  ?  ?  ?  ? ? ?No results found for: NITRICOXIDE ? ? ? ? ? ?Assessment & Plan:  ? ?OSA (obstructive sleep apnea) ?Recent HST showed severe OSA with AHI 49.5. Agreeable to CPAP therapy. Given her significant desaturations, ordered CPAP titration to assess for need for supplemental O2 or possible BiPAP therapy.  ? ?Patient Instructions  ?  Start to use CPAP auto 10-20 cmH2O every night, minimum of 4-6 hours a night.  ?Change equipment every 30 days or as directed by DME. Wash your tubing with warm soap and water daily, hang to dry. Wash humidifier portion weekly.  ?Be aware of reduced alertness and do not drive or operate heavy machinery if experiencing this or drowsiness.  ?Healthy weight management discussed.  ?Avoid or decrease alcohol consumption and medications that make you more sleepy, if possible. ?Notify if persistent daytime sleepiness occurs even with consistent use of CPAP. ? ?We discussed how untreated sleep apnea puts an individual at risk for cardiac arrhthymias, pulm HTN, DM, stroke and increases their risk for daytime accidents.  ? ?CPAP titration study ordered today. Someone will contact you for scheduling. ? ?Follow up with Dr. Craige Cotta or Colleen Addison Codi Folkerts,NP after sleep study. Please contact the office to schedule your appointment after you schedule your sleep study. If symptoms do not improve or worsen, please contact office for sooner follow up or seek emergency care. ? ? ? ?Obesity ?Healthy weight management discussed. Pt actively trying to lose  weight. Advised we can refer to healthy weight management if she would like; may consider in future. ? ? ?I spent 32 minutes of dedicated to the care of this patient on the date of this encounter to include pre-v

## 2021-04-30 NOTE — Patient Instructions (Addendum)
Start to use CPAP auto 10-20 cmH2O every night, minimum of 4-6 hours a night.  ?Change equipment every 30 days or as directed by DME. Wash your tubing with warm soap and water daily, hang to dry. Wash humidifier portion weekly.  ?Be aware of reduced alertness and do not drive or operate heavy machinery if experiencing this or drowsiness.  ?Healthy weight management discussed.  ?Avoid or decrease alcohol consumption and medications that make you more sleepy, if possible. ?Notify if persistent daytime sleepiness occurs even with consistent use of CPAP. ? ?We discussed how untreated sleep apnea puts an individual at risk for cardiac arrhthymias, pulm HTN, DM, stroke and increases their risk for daytime accidents.  ? ?CPAP titration study ordered today. Someone will contact you for scheduling. ? ?Follow up with Dr. Craige Cotta or Florentina Addison [place],NP after sleep study. Please contact the office to schedule your appointment after you schedule your sleep study. If symptoms do not improve or worsen, please contact office for sooner follow up or seek emergency care. ?

## 2021-04-30 NOTE — Progress Notes (Signed)
Reviewed and agree with assessment/plan. ? ? ?Chesley Mires, MD ?[place] ?04/30/2021, 9:59 AM ?Pager:  [phone] ? ?

## 2021-04-30 NOTE — Assessment & Plan Note (Signed)
Healthy weight management discussed. Pt actively trying to lose weight. Advised we can refer to healthy weight management if she would like; may consider in future. ?

## 2021-04-30 NOTE — Assessment & Plan Note (Signed)
Recent HST showed severe OSA with AHI 49.5. Agreeable to CPAP therapy. Given her significant desaturations, ordered CPAP titration to assess for need for supplemental O2 or possible BiPAP therapy.  ? ?Patient Instructions  ?Start to use CPAP auto 10-20 cmH2O every night, minimum of 4-6 hours a night.  ?Change equipment every 30 days or as directed by DME. Wash your tubing with warm soap and water daily, hang to dry. Wash humidifier portion weekly.  ?Be aware of reduced alertness and do not drive or operate heavy machinery if experiencing this or drowsiness.  ?Healthy weight management discussed.  ?Avoid or decrease alcohol consumption and medications that make you more sleepy, if possible. ?Notify if persistent daytime sleepiness occurs even with consistent use of CPAP. ? ?We discussed how untreated sleep apnea puts an individual at risk for cardiac arrhthymias, pulm HTN, DM, stroke and increases their risk for daytime accidents.  ? ?CPAP titration study ordered today. Someone will contact you for scheduling. ? ?Follow up with Dr. Craige Cotta or Florentina Addison Paiton Fosco,NP after sleep study. Please contact the office to schedule your appointment after you schedule your sleep study. If symptoms do not improve or worsen, please contact office for sooner follow up or seek emergency care. ? ? ?

## 2021-05-07 ENCOUNTER — Telehealth: Payer: Self-pay | Admitting: Internal Medicine

## 2021-05-07 ENCOUNTER — Other Ambulatory Visit: Payer: Self-pay | Admitting: *Deleted

## 2021-05-07 DIAGNOSIS — E119 Type 2 diabetes mellitus without complications: Secondary | ICD-10-CM

## 2021-05-07 DIAGNOSIS — I10 Essential (primary) hypertension: Secondary | ICD-10-CM

## 2021-05-07 MED ORDER — OLMESARTAN MEDOXOMIL-HCTZ 40-12.5 MG PO TABS: 1.0000 | ORAL_TABLET | Freq: Every day | ORAL | 1 refills | Status: DC

## 2021-05-07 MED ORDER — METFORMIN HCL 500 MG PO TABS: 500.0000 mg | ORAL_TABLET | Freq: Two times a day (BID) | ORAL | 1 refills | Status: DC

## 2021-05-07 MED ORDER — AMLODIPINE BESYLATE 10 MG PO TABS: 10.0000 mg | ORAL_TABLET | Freq: Every day | ORAL | 1 refills | Status: DC

## 2021-05-07 NOTE — Telephone Encounter (Signed)
Medication sent to pharmacy for pt.  

## 2021-05-07 NOTE — Telephone Encounter (Signed)
Patient called in regard to meds that pharm has not received as well as patient not receiving. ? ? ? ?Patient needs meds filled ? ? ?amLODipine (NORVASC) 10 MG tablet  ? ?olmesartan-hydrochlorothiazide (BENICAR HCT) 40-12.5 MG tablet  ? ?metFORMIN (GLUCOPHAGE) 500 MG tablet  ?

## 2021-06-08 ENCOUNTER — Encounter: Payer: 59 | Admitting: Pulmonary Disease

## 2021-06-24 ENCOUNTER — Encounter: Payer: 59 | Admitting: Pulmonary Disease

## 2021-06-24 ENCOUNTER — Ambulatory Visit (HOSPITAL_BASED_OUTPATIENT_CLINIC_OR_DEPARTMENT_OTHER): Payer: 59 | Attending: Nurse Practitioner | Admitting: Pulmonary Disease

## 2021-06-24 DIAGNOSIS — G4733 Obstructive sleep apnea (adult) (pediatric): Secondary | ICD-10-CM | POA: Insufficient documentation

## 2021-06-29 ENCOUNTER — Encounter: Payer: Self-pay | Admitting: Internal Medicine

## 2021-06-29 ENCOUNTER — Ambulatory Visit (INDEPENDENT_AMBULATORY_CARE_PROVIDER_SITE_OTHER): Payer: 59 | Admitting: Internal Medicine

## 2021-06-29 VITALS — BP 138/84 | HR 72 | Resp 18 | Ht 67.0 in | Wt 233.8 lb

## 2021-06-29 DIAGNOSIS — I10 Essential (primary) hypertension: Secondary | ICD-10-CM | POA: Diagnosis not present

## 2021-06-29 DIAGNOSIS — G4733 Obstructive sleep apnea (adult) (pediatric): Secondary | ICD-10-CM | POA: Diagnosis not present

## 2021-06-29 DIAGNOSIS — E119 Type 2 diabetes mellitus without complications: Secondary | ICD-10-CM | POA: Diagnosis not present

## 2021-06-29 LAB — POCT GLYCOSYLATED HEMOGLOBIN (HGB A1C)
HbA1c POC (<> result, manual entry): 8.6 % (ref 4.0–5.6)
HbA1c, POC (controlled diabetic range): 8.6 % — AB (ref 0.0–7.0)

## 2021-06-30 ENCOUNTER — Telehealth: Payer: Self-pay | Admitting: Pulmonary Disease

## 2021-06-30 DIAGNOSIS — G4733 Obstructive sleep apnea (adult) (pediatric): Secondary | ICD-10-CM

## 2021-06-30 NOTE — Telephone Encounter (Signed)
CPAP 06/24/21 >> CPAP 11 cm H2O >> AHI 1.3.     Please let her know she did well with CPAP.  Please send order for Resmed CPAP 11 cm H2O with heated humidity and mask of choice.  She needs ROV in 4 months.

## 2021-07-01 LAB — MICROALBUMIN / CREATININE URINE RATIO
Creatinine, Urine: 196 mg/dL
Microalb/Creat Ratio: 8 mg/g creat (ref 0–29)
Microalbumin, Urine: 15.8 ug/mL

## 2021-07-08 NOTE — Telephone Encounter (Signed)
Patient returned call. Went over results with her and she states she never received her first CPAP machine because [facility] wanted too high of a monthly payment. I voiced understanding and asked patient if she wanted [place] to send the order to a new DME and she was unsure. She states her hours were recently cut back at work and patient has no extra money to contribute towards a CPAP at this time.  Patient asked if she could have an appt with Dr. Craige Cotta to talk about her other options and get details of study. Scheduled an appt for patient with Dr. Craige Cotta next available in [place] and mailed appt reminder to her. Nothing further needed at this time.

## 2021-07-08 NOTE — Telephone Encounter (Signed)
ATC patient.  LMTCB. 

## 2021-08-04 ENCOUNTER — Other Ambulatory Visit (HOSPITAL_COMMUNITY): Payer: Self-pay | Admitting: Internal Medicine

## 2021-08-04 DIAGNOSIS — Z1231 Encounter for screening mammogram for malignant neoplasm of breast: Secondary | ICD-10-CM

## 2021-08-25 ENCOUNTER — Encounter: Payer: Self-pay | Admitting: Pulmonary Disease

## 2021-08-25 ENCOUNTER — Ambulatory Visit (INDEPENDENT_AMBULATORY_CARE_PROVIDER_SITE_OTHER): Payer: Self-pay | Admitting: Pulmonary Disease

## 2021-08-25 VITALS — BP 160/100 | HR 78 | Ht 67.0 in | Wt 232.0 lb

## 2021-08-25 DIAGNOSIS — G4733 Obstructive sleep apnea (adult) (pediatric): Secondary | ICD-10-CM

## 2021-08-25 NOTE — Progress Notes (Signed)
   [facility]  Chief Complaint  Patient presents with   Follow-up    Follow-up    Past Surgical History:  She  has a past surgical history that includes Dental surgery; Cholecystectomy; and Colonoscopy with propofol (N/A, 10/21/2020).  Past Medical History:  Allergies, DM, HTN  Constitutional:  BP (!) 160/100 (BP Location: Right Arm)   Pulse 78   Ht 5\' 7"  (1.702 m)   Wt 232 lb (105.2 kg)   SpO2 95%   BMI 36.34 kg/m   Brief Summary:  Colleen Rowe is a 61 y.o. female with obstructive sleep apnea.      Subjective:   She hasn't received her CPAP yet.  She had issues with expense of therapy.  Physical Exam:     Sleep Tests:  HST 04/20/21 >> AHI 49.5, SpO2 low 55% CPAP 06/24/21 >> CPAP 11 cm H2O >> AHI 1.3.    Social History:  She  reports that she has never smoked. She has never used smokeless tobacco. She reports that she does not currently use alcohol. She reports that she does not use drugs.  Family History:  Her family history includes Breast cancer in her mother and sister; Diabetes in her mother; Hypertension in her mother.     Assessment/Plan:   Obstructive sleep apnea. - will reschedule visit until after she has been on CPAP therapy  Time Spent Involved in Patient Care on Day of Examination:    Follow up:   There are no Patient Instructions on file for this visit.  Medication List:   Allergies as of 08/25/2021   No Known Allergies      Medication List        Accurate as of August 25, 2021 10:05 AM. If you have any questions, ask your nurse or doctor.          acetaminophen 500 MG tablet Commonly known as: TYLENOL Take 500-1,000 mg by mouth every 6 (six) hours as needed for moderate pain.   ALKA-SELTZER HEARTBURN PO Take 2 tablets by mouth daily as needed (heartburn).   amLODipine 10 MG tablet Commonly known as: NORVASC Take 1 tablet (10 mg total) by mouth daily.   diphenhydrAMINE 25 MG  tablet Commonly known as: BENADRYL Take 25 mg by mouth daily as needed for allergies.   ferrous gluconate 240 (27 FE) MG tablet Commonly known as: FERGON Take 480 mg by mouth daily.   fexofenadine 180 MG tablet Commonly known as: ALLEGRA Take 180 mg by mouth daily as needed for allergies or rhinitis.   GAS-X PO Take 1 tablet by mouth daily as needed (gas).   metFORMIN 500 MG tablet Commonly known as: GLUCOPHAGE Take 1 tablet (500 mg total) by mouth 2 (two) times daily with a meal.   olmesartan-hydrochlorothiazide 40-12.5 MG tablet Commonly known as: BENICAR HCT Take 1 tablet by mouth daily.   VISINE OP Place 1 drop into both eyes daily as needed (redness).   vitamin B-12 500 MCG tablet Commonly known as: CYANOCOBALAMIN Take 500 mcg by mouth daily.   Vitamin D3 50 MCG (2000 UT) Tabs Take 2,000 Units by mouth daily.        Signature:  August 27, 2021, MD [facility] Pager - [phone] 08/25/2021, 10:05 AM

## 2021-09-03 ENCOUNTER — Ambulatory Visit (HOSPITAL_COMMUNITY): Payer: 59

## 2021-09-22 ENCOUNTER — Other Ambulatory Visit: Payer: Self-pay | Admitting: *Deleted

## 2021-09-22 ENCOUNTER — Telehealth: Payer: Self-pay

## 2021-09-22 DIAGNOSIS — I10 Essential (primary) hypertension: Secondary | ICD-10-CM

## 2021-09-22 MED ORDER — OLMESARTAN MEDOXOMIL-HCTZ 40-12.5 MG PO TABS: 1.0000 | ORAL_TABLET | Freq: Every day | ORAL | 1 refills | Status: DC

## 2021-09-22 MED ORDER — AMLODIPINE BESYLATE 10 MG PO TABS: 10.0000 mg | ORAL_TABLET | Freq: Every day | ORAL | 1 refills | Status: DC

## 2021-09-22 NOTE — Telephone Encounter (Signed)
Patient need med refill  amLODipine (NORVASC) 10 MG tablet   olmesartan-hydrochlorothiazide (BENICAR HCT) 40-12.5 MG tablet     Pharmacy: [facility] [place] [facility]

## 2021-09-22 NOTE — Telephone Encounter (Signed)
Medication refilled

## 2021-09-23 ENCOUNTER — Telehealth: Payer: Self-pay | Admitting: Nurse Practitioner

## 2021-09-23 DIAGNOSIS — G4733 Obstructive sleep apnea (adult) (pediatric): Secondary | ICD-10-CM

## 2021-09-23 NOTE — Telephone Encounter (Signed)
Called and spoke to [place] with [facility]. He states since patient declined CPAP after they received the originall order that we will have to send in a new order for the patient to receive new cpap.   Called and spoke to patient and she voiced understanding. She would like to proceed with getting CPAP now. New order placed. Nothing further needed

## 2021-10-27 ENCOUNTER — Encounter: Payer: Self-pay | Admitting: Internal Medicine

## 2021-10-27 ENCOUNTER — Ambulatory Visit (INDEPENDENT_AMBULATORY_CARE_PROVIDER_SITE_OTHER): Payer: 59 | Admitting: Internal Medicine

## 2021-10-27 VITALS — BP 138/84 | HR 85 | Resp 18 | Ht 68.0 in | Wt 222.4 lb

## 2021-10-27 DIAGNOSIS — E782 Mixed hyperlipidemia: Secondary | ICD-10-CM | POA: Diagnosis not present

## 2021-10-27 DIAGNOSIS — Z2821 Immunization not carried out because of patient refusal: Secondary | ICD-10-CM

## 2021-10-27 DIAGNOSIS — I10 Essential (primary) hypertension: Secondary | ICD-10-CM

## 2021-10-27 DIAGNOSIS — G4733 Obstructive sleep apnea (adult) (pediatric): Secondary | ICD-10-CM

## 2021-10-27 DIAGNOSIS — Z0001 Encounter for general adult medical examination with abnormal findings: Secondary | ICD-10-CM

## 2021-10-27 DIAGNOSIS — E119 Type 2 diabetes mellitus without complications: Secondary | ICD-10-CM | POA: Diagnosis not present

## 2021-10-27 DIAGNOSIS — E559 Vitamin D deficiency, unspecified: Secondary | ICD-10-CM | POA: Diagnosis not present

## 2021-10-27 MED ORDER — BLOOD GLUCOSE METER KIT: PACK | 0 refills | Status: DC

## 2021-10-27 MED ORDER — OLMESARTAN MEDOXOMIL-HCTZ 40-12.5 MG PO TABS: 1.0000 | ORAL_TABLET | Freq: Every day | ORAL | 1 refills | Status: DC

## 2021-10-27 MED ORDER — METFORMIN HCL 500 MG PO TABS: 500.0000 mg | ORAL_TABLET | Freq: Two times a day (BID) | ORAL | 1 refills | Status: DC

## 2021-10-27 MED ORDER — AMLODIPINE BESYLATE 10 MG PO TABS: 10.0000 mg | ORAL_TABLET | Freq: Every day | ORAL | 1 refills | Status: DC

## 2021-10-27 NOTE — Assessment & Plan Note (Signed)
Lab Results  Component Value Date   HGBA1C 8.6 06/29/2021   HGBA1C 8.6 (A) 06/29/2021   Uncontrolled Associated with HTN, OSA and obesity on metformin, needs to be compliant Advised to follow diabetic diet Once she is compliant to current regimen, will start statin On ARB F/u CMP and lipid panel Diabetic eye exam: Advised to follow up with Ophthalmology for diabetic eye exam 

## 2021-10-27 NOTE — Assessment & Plan Note (Signed)
BP Readings from Last 1 Encounters:  10/27/21 138/84   Well-controlled with Olmesartan-HCTZ  and Amlodipine Appears to be a component of OSA as well Counseled for compliance with the medications Advised DASH diet and moderate exercise/walking, at least 150 mins/week

## 2021-10-27 NOTE — Assessment & Plan Note (Signed)

## 2021-10-27 NOTE — Patient Instructions (Signed)
Please continue taking medications as prescribed.  Please continue to follow low carb diet and perform moderate exercise/walking at least 150 mins/week. 

## 2021-10-27 NOTE — Assessment & Plan Note (Signed)
F/u with Pulmonology Awaiting call for CPAP device

## 2021-10-27 NOTE — Progress Notes (Signed)
Established Patient Office Visit  Subjective:  Patient ID: Colleen Rowe, female    DOB: March 06, 1960  Age: 61 y.o. MRN: [id number]  CC:  Chief Complaint  Patient presents with   Annual Exam    Annual exam     HPI Colleen Rowe is a 60 y.o. female with past medical history of HTN, type II DM and morbid obesity who presents for annual physical.  HTN: BP is well-controlled usually.  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 Takes medications regularly. Patient denies headache, dizziness, chest pain, dyspnea or palpitations.  Type II DM: Her HbA1c was elevated at 8.6 in 06/23.  She had been taking metformin only QD instead of BID, but has started taking it BID now.  She denies any polyuria or polydipsia currently. She also reports that she has improved her diet and has lost about 11 lbs since the visit.  She had sleep study done and is awaiting call for CPAP device.    Past Medical History:  Diagnosis Date   Allergies    Diabetes mellitus without complication ([place])    Hypertension     Past Surgical History:  Procedure Laterality Date   CHOLECYSTECTOMY     COLONOSCOPY WITH PROPOFOL N/A 10/21/2020   Procedure: COLONOSCOPY WITH PROPOFOL;  Surgeon: Eloise Harman, DO;  Location: [facility];  Service: Endoscopy;  Laterality: N/A;  9:00 / ASA II   DENTAL SURGERY      Family History  Problem Relation Age of Onset   Breast cancer Mother    Diabetes Mother    Hypertension Mother    Breast cancer Sister    Colon cancer Neg Hx     Social History   Socioeconomic History   Marital status: Divorced    Spouse name: Not on file   Number of children: 2   Years of education: Not on file   Highest education level: Not on file  Occupational History   Not on file  Tobacco Use   Smoking status: Never   Smokeless tobacco: Never  Vaping Use   Vaping Use: Never used  Substance and Sexual Activity   Alcohol use: Not  Currently   Drug use: Never   Sexual activity: Not Currently    Birth control/protection: Post-menopausal  Other Topics Concern   Not on file  Social History Narrative   Not on file   Social Determinants of Health   Financial Resource Strain: Unknown (10/13/2020)   Overall Financial Resource Strain (CARDIA)    Difficulty of Paying Living Expenses: Patient refused  Food Insecurity: Unknown (10/13/2020)   Hunger Vital Sign    Worried About Running Out of Food in the Last Year: Patient refused    [place] in the Last Year: Patient refused  Transportation Needs: No Transportation Needs (10/13/2020)   PRAPARE - Hydrologist (Medical): No    Lack of Transportation (Non-Medical): No  Physical Activity: Sufficiently Active (10/13/2020)   Exercise Vital Sign    Days of Exercise per Week: 4 days    Minutes of Exercise per Session: 100 min  Stress: No Stress Concern Present (10/13/2020)   [place]    Feeling of Stress : Not at all  Social Connections: Unknown (10/13/2020)   Social Connection and Isolation Panel [NHANES]    Frequency of Communication with Friends and Family: Patient  refused    Frequency of Social Gatherings with Friends and Family: Patient refused    Attends Religious Services: Patient refused    Active Member of Clubs or Organizations: No    Attends Archivist Meetings: Never    Marital Status: Divorced  Human resources officer Violence: Not At Risk (10/13/2020)   Humiliation, Afraid, Rape, and Kick questionnaire    Fear of Current or Ex-Partner: No    Emotionally Abused: No    Physically Abused: No    Sexually Abused: No    Outpatient Medications Prior to Visit  Medication Sig Dispense Refill   acetaminophen (TYLENOL) 500 MG tablet Take 500-1,000 mg by mouth every 6 (six) hours as needed for moderate pain.     Calcium Carbonate Antacid (ALKA-SELTZER  HEARTBURN PO) Take 2 tablets by mouth daily as needed (heartburn).     Cholecalciferol (VITAMIN D3) 50 MCG (2000 UT) TABS Take 2,000 Units by mouth daily.     diphenhydrAMINE (BENADRYL) 25 MG tablet Take 25 mg by mouth daily as needed for allergies.     ferrous gluconate (FERGON) 240 (27 FE) MG tablet Take 480 mg by mouth daily.     fexofenadine (ALLEGRA) 180 MG tablet Take 180 mg by mouth daily as needed for allergies or rhinitis.     Simethicone (GAS-X PO) Take 1 tablet by mouth daily as needed (gas).     Tetrahydrozoline HCl (VISINE OP) Place 1 drop into both eyes daily as needed (redness).     vitamin B-12 (CYANOCOBALAMIN) 500 MCG tablet Take 500 mcg by mouth daily.     amLODipine (NORVASC) 10 MG tablet Take 1 tablet (10 mg total) by mouth daily. 90 tablet 1   metFORMIN (GLUCOPHAGE) 500 MG tablet Take 1 tablet (500 mg total) by mouth 2 (two) times daily with a meal. 180 tablet 1   olmesartan-hydrochlorothiazide (BENICAR HCT) 40-12.5 MG tablet Take 1 tablet by mouth daily. 90 tablet 1   No facility-administered medications prior to visit.    No Known Allergies  ROS Review of Systems  Constitutional:  Negative for chills and fever.  HENT:  Negative for congestion, sinus pressure, sinus pain and sore throat.   Eyes:  Negative for pain and discharge.  Respiratory:  Positive for apnea. Negative for cough and shortness of breath.   Cardiovascular:  Negative for chest pain and palpitations.  Gastrointestinal:  Negative for abdominal pain, constipation, diarrhea, nausea and vomiting.  Endocrine: Negative for polydipsia and polyuria.  Genitourinary:  Negative for dysuria and hematuria.  Musculoskeletal:  Negative for neck pain and neck stiffness.  Skin:  Negative for rash.  Neurological:  Negative for dizziness and weakness.  Psychiatric/Behavioral:  Negative for agitation and behavioral problems.       Objective:    Physical Exam Vitals reviewed.  Constitutional:      General: She  is not in acute distress.    Appearance: She is not diaphoretic.  HENT:     Head: Normocephalic and atraumatic.     Nose: Nose normal.     Mouth/Throat:     Mouth: Mucous membranes are moist.  Eyes:     General: No scleral icterus.    Extraocular Movements: Extraocular movements intact.  Neck:     Vascular: No carotid bruit.  Cardiovascular:     Rate and Rhythm: Normal rate and regular rhythm.     Pulses: Normal pulses.     Heart sounds: Normal heart sounds. No murmur heard. Pulmonary:     Breath  sounds: Normal breath sounds. No wheezing or rales.  Abdominal:     Palpations: Abdomen is soft.     Tenderness: There is no abdominal tenderness.  Musculoskeletal:     Cervical back: Neck supple. No tenderness.     Right lower leg: No edema.     Left lower leg: No edema.  Feet:     Left foot:     Skin integrity: Dry skin present.  Skin:    General: Skin is warm.     Findings: No rash.  Neurological:     General: No focal deficit present.     Mental Status: She is alert and oriented to person, place, and time.     Cranial Nerves: No cranial nerve deficit.     Sensory: No sensory deficit.     Motor: No weakness.  Psychiatric:        Mood and Affect: Mood normal.        Behavior: Behavior normal.     BP 138/84 (BP Location: Left Arm, Patient Position: Sitting, Cuff Size: Normal)   Pulse 85   Resp 18   Ht '5\' 8"'  (1.727 m)   Wt 222 lb 6.4 oz (100.9 kg)   SpO2 98%   BMI 33.82 kg/m  Wt Readings from Last 3 Encounters:  10/27/21 222 lb 6.4 oz (100.9 kg)  08/25/21 232 lb (105.2 kg)  06/29/21 233 lb 12.8 oz (106.1 kg)    Lab Results  Component Value Date   TSH 1.920 09/09/2020   Lab Results  Component Value Date   WBC 9.5 09/09/2020   HGB 12.8 09/09/2020   HCT 39.6 09/09/2020   MCV 83 09/09/2020   PLT 235 09/09/2020   Lab Results  Component Value Date   NA 141 03/25/2021   K 3.9 03/25/2021   CO2 24 03/25/2021   GLUCOSE 162 (H) 03/25/2021   BUN 9 03/25/2021    CREATININE 0.94 03/25/2021   BILITOT <0.2 03/25/2021   ALKPHOS 115 03/25/2021   AST 35 03/25/2021   ALT 37 (H) 03/25/2021   PROT 7.2 03/25/2021   ALBUMIN 4.2 03/25/2021   CALCIUM 9.0 03/25/2021   ANIONGAP 9 03/28/2014   EGFR 69 03/25/2021   Lab Results  Component Value Date   CHOL 131 03/25/2021   Lab Results  Component Value Date   HDL 41 03/25/2021   Lab Results  Component Value Date   LDLCALC 65 03/25/2021   Lab Results  Component Value Date   TRIG 142 03/25/2021   Lab Results  Component Value Date   CHOLHDL 3.2 03/25/2021   Lab Results  Component Value Date   HGBA1C 8.6 06/29/2021   HGBA1C 8.6 (A) 06/29/2021      Assessment & Plan:   Problem List Items Addressed This Visit       Cardiovascular and Mediastinum   Essential hypertension    BP Readings from Last 1 Encounters:  10/27/21 138/84  Well-controlled with Olmesartan-HCTZ  and Amlodipine Appears to be a component of OSA as well Counseled for compliance with the medications Advised DASH diet and moderate exercise/walking, at least 150 mins/week      Relevant Medications   olmesartan-hydrochlorothiazide (BENICAR HCT) 40-12.5 MG tablet   amLODipine (NORVASC) 10 MG tablet   Other Relevant Orders   TSH   CMP14+EGFR     Respiratory   OSA (obstructive sleep apnea)    F/u with Pulmonology Awaiting call for CPAP device      Relevant Orders   TSH  CMP14+EGFR   CBC with Differential/Platelet     Endocrine   Type 2 diabetes mellitus without complication, without long-term current use of insulin (HCC)    Lab Results  Component Value Date   HGBA1C 8.6 06/29/2021   HGBA1C 8.6 (A) 06/29/2021  Uncontrolled Associated with HTN, OSA and obesity on metformin, needs to be compliant Advised to follow diabetic diet Once she is compliant to current regimen, will start statin On ARB F/u CMP and lipid panel Diabetic eye exam: Advised to follow up with Ophthalmology for diabetic eye exam       Relevant Medications   olmesartan-hydrochlorothiazide (BENICAR HCT) 40-12.5 MG tablet   metFORMIN (GLUCOPHAGE) 500 MG tablet   Other Relevant Orders   Hemoglobin A1c   CMP14+EGFR     Other   Mixed hyperlipidemia   Relevant Medications   olmesartan-hydrochlorothiazide (BENICAR HCT) 40-12.5 MG tablet   amLODipine (NORVASC) 10 MG tablet   Other Relevant Orders   Lipid panel   Encounter for general adult medical examination with abnormal findings - Primary    Physical exam as documented. Counseling done  re healthy lifestyle involving commitment to 150 minutes exercise per week, heart healthy diet, and attaining healthy weight.The importance of adequate sleep also discussed. Changes in health habits are decided on by the patient with goals and time frames  set for achieving them. Immunization and cancer screening needs are specifically addressed at this visit.      Relevant Orders   CMP14+EGFR   CBC with Differential/Platelet   Other Visit Diagnoses     Refused influenza vaccine       Vitamin D deficiency       Relevant Orders   VITAMIN D 25 Hydroxy (Vit-D Deficiency, Fractures)       Meds ordered this encounter  Medications   olmesartan-hydrochlorothiazide (BENICAR HCT) 40-12.5 MG tablet    Sig: Take 1 tablet by mouth daily.    Dispense:  90 tablet    Refill:  1   amLODipine (NORVASC) 10 MG tablet    Sig: Take 1 tablet (10 mg total) by mouth daily.    Dispense:  90 tablet    Refill:  1   metFORMIN (GLUCOPHAGE) 500 MG tablet    Sig: Take 1 tablet (500 mg total) by mouth 2 (two) times daily with a meal.    Dispense:  180 tablet    Refill:  1    Follow-up: Return in about 4 months (around 02/27/2022) for DM and HTN.    Lindell Spar, MD

## 2021-10-27 NOTE — Addendum Note (Signed)
Addended by: Zacarias Pontes R on: 10/27/2021 10:44 AM   Modules accepted: Orders

## 2021-11-25 ENCOUNTER — Telehealth: Payer: Self-pay | Admitting: Nurse Practitioner

## 2021-11-27 NOTE — Telephone Encounter (Signed)
Spoke with Colleen Rowe at [facility] who states their office has been trying to reach pt but pt has not returned office's call. Spoke with pt and gave her phone number to call [facility] office. Pt stated understanding. Nothing further needed at this time.

## 2021-11-30 ENCOUNTER — Telehealth: Payer: Self-pay | Admitting: Pulmonary Disease

## 2021-11-30 NOTE — Telephone Encounter (Signed)
Colleen Rowe  to Colleen Rowe     11/30/21 10:41 AM Pt would like to have CPAP order sent to [facility].  Pt is having issues reaching [facility] about CPAP.  See previous encounters.  Verified phone number with pt.  Pt was told she would get a call back from [facility] with no result.  Please advise.  PCCs please advise, can we change order for CPAP to go to [facility]?

## 2021-12-01 NOTE — Telephone Encounter (Signed)
Called and spoke to patient since there was a missed call from her.   She states [place] does not take her insurance would like order sent to a different DME that accepts her insurance and is close by. She had a hard time getting in touch with [facility]. Please advise, PCCs. Thanks!

## 2021-12-10 ENCOUNTER — Telehealth: Payer: Self-pay | Admitting: Pulmonary Disease

## 2021-12-10 NOTE — Telephone Encounter (Signed)
Pt called [facility] and stated that pharmacist told her they are only allowed five a month and won't have one until Feb.  Question another DME to send order to.  Pt has [employer] [facility].  Please advise.   Will route to Taryn to see if there is another DME we can try.  Patient has tried [facility] and [facility]

## 2022-01-05 DIAGNOSIS — G4733 Obstructive sleep apnea (adult) (pediatric): Secondary | ICD-10-CM | POA: Diagnosis not present

## 2022-01-11 IMAGING — MG MM DIGITAL SCREENING BILAT W/ TOMO AND CAD
6 of 10 series · 6 of 30 positions shown · non-contrast
Comparison: Previous exam(s).

CLINICAL DATA: Screening.

EXAM:
DIGITAL SCREENING BILATERAL MAMMOGRAM WITH TOMOSYNTHESIS AND CAD
TECHNIQUE: Bilateral screening digital craniocaudal and mediolateral oblique
mammograms were obtained. Bilateral screening digital breast
tomosynthesis was performed. The images were evaluated with
computer-aided detection.

[L CC synth-2D]
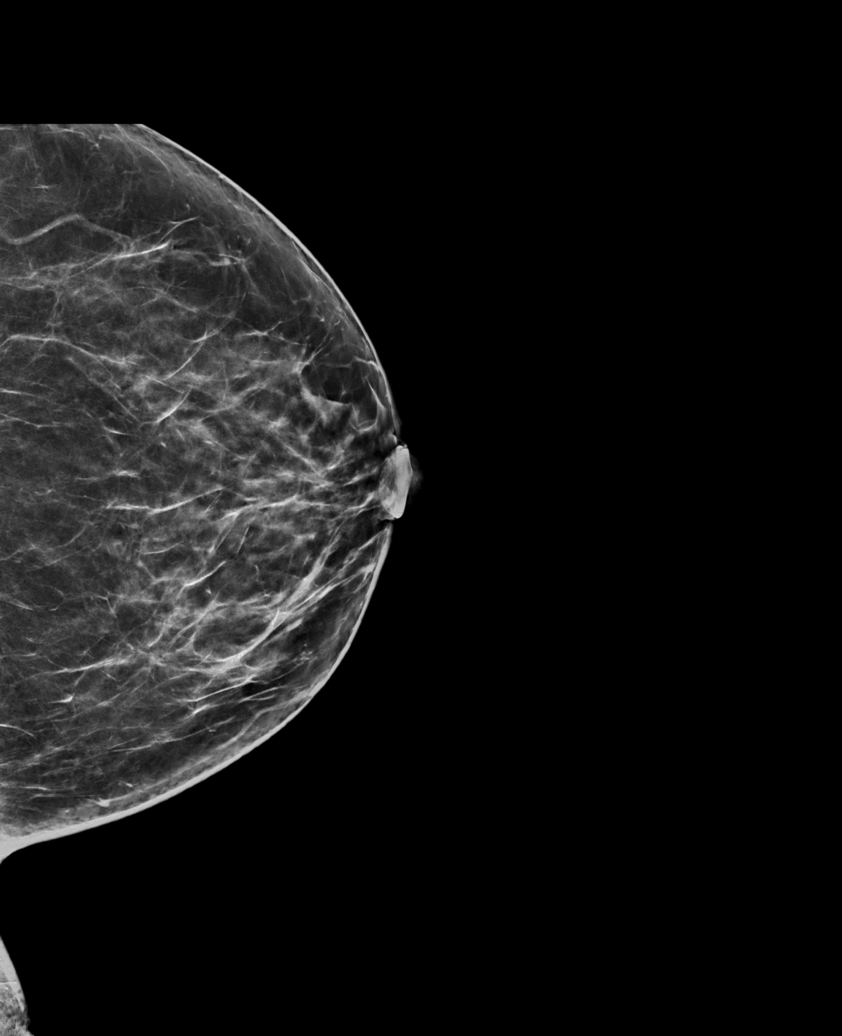

[R MLO synth-2D]
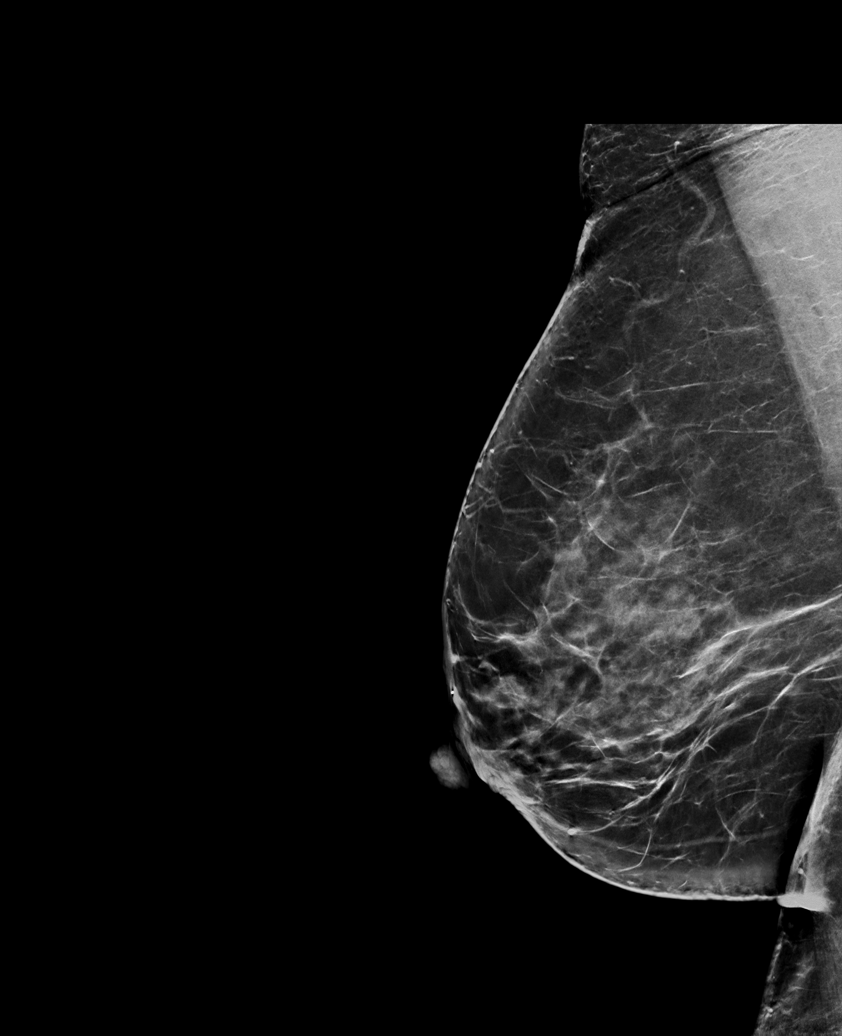

[L MLO synth-2D (1 of 2)]
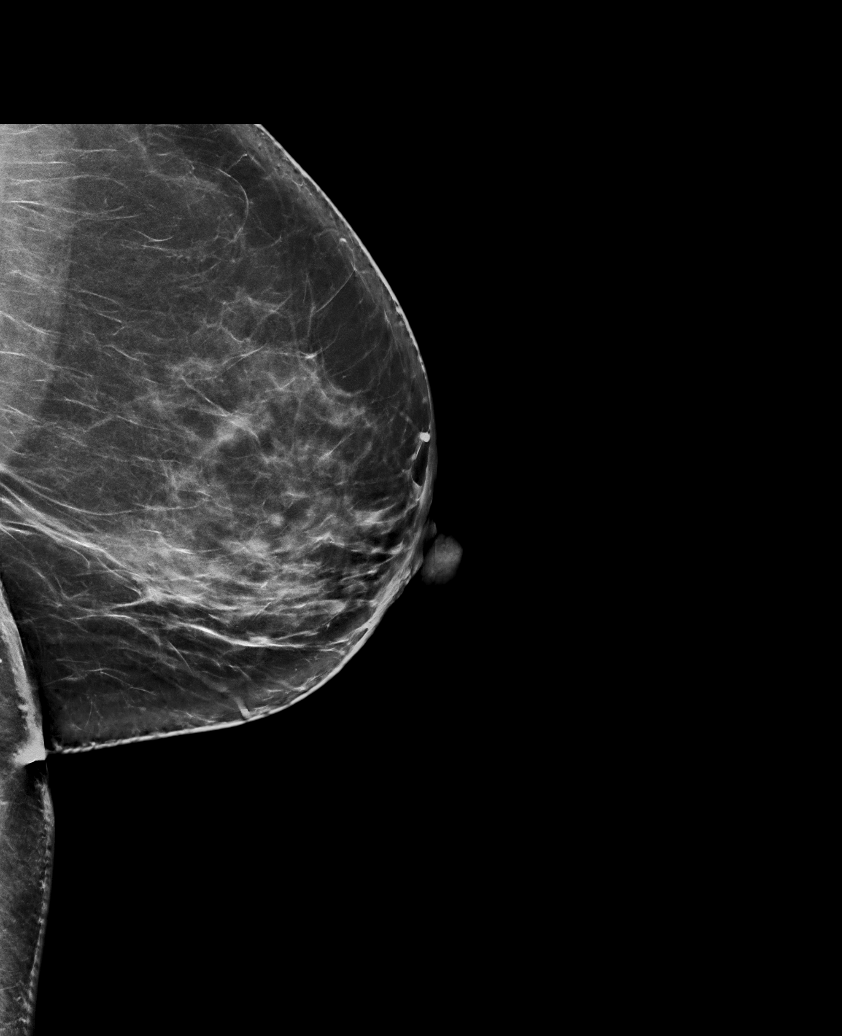

[R CC synth-2D]
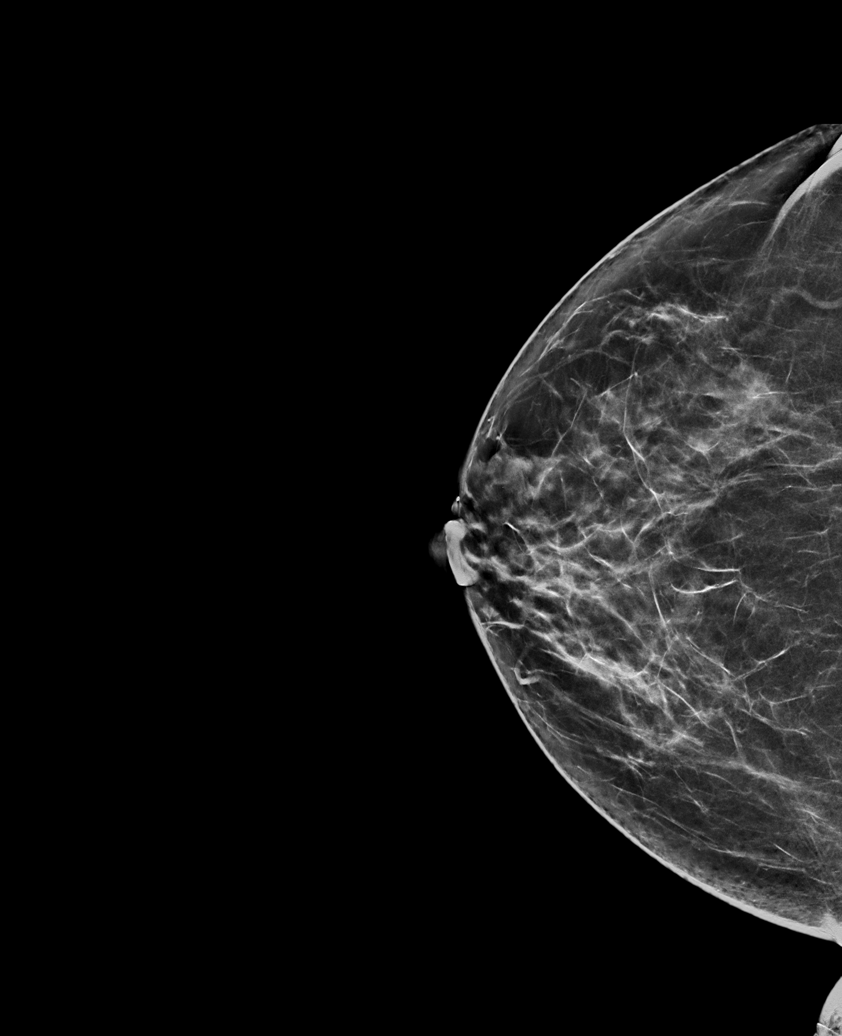

[L MLO synth-2D (2 of 2)]
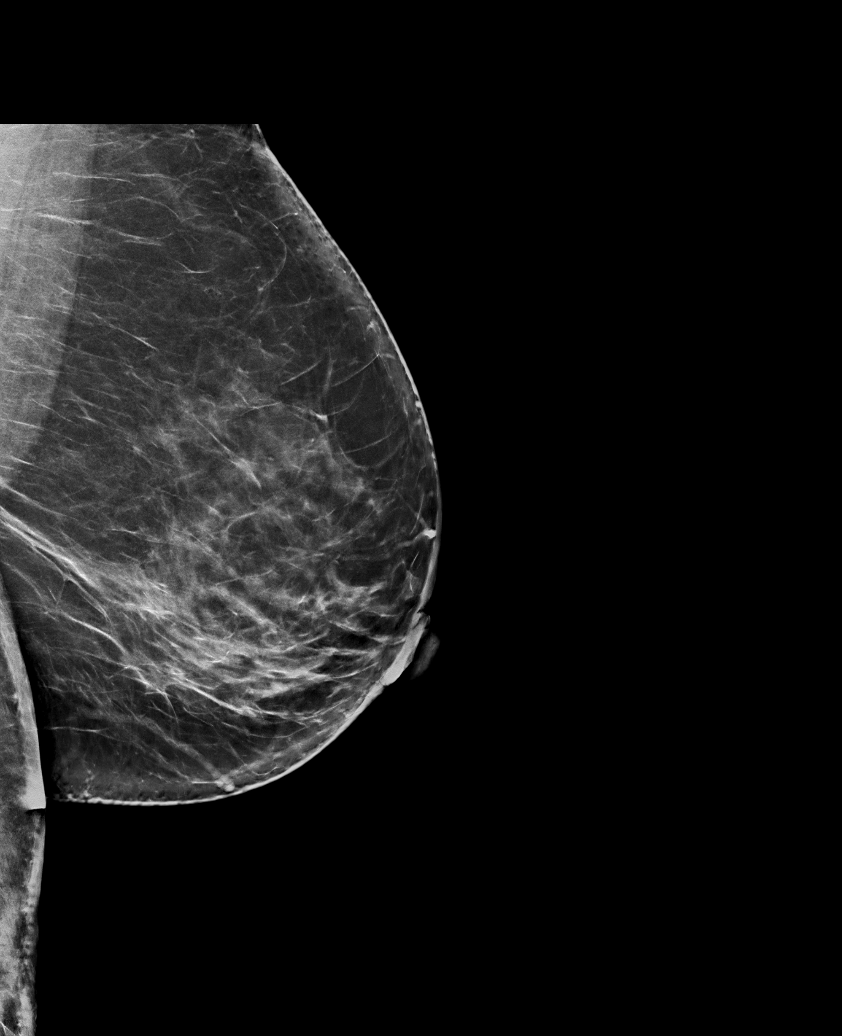

[L MLO tomo · tomo slice 41/82.0]
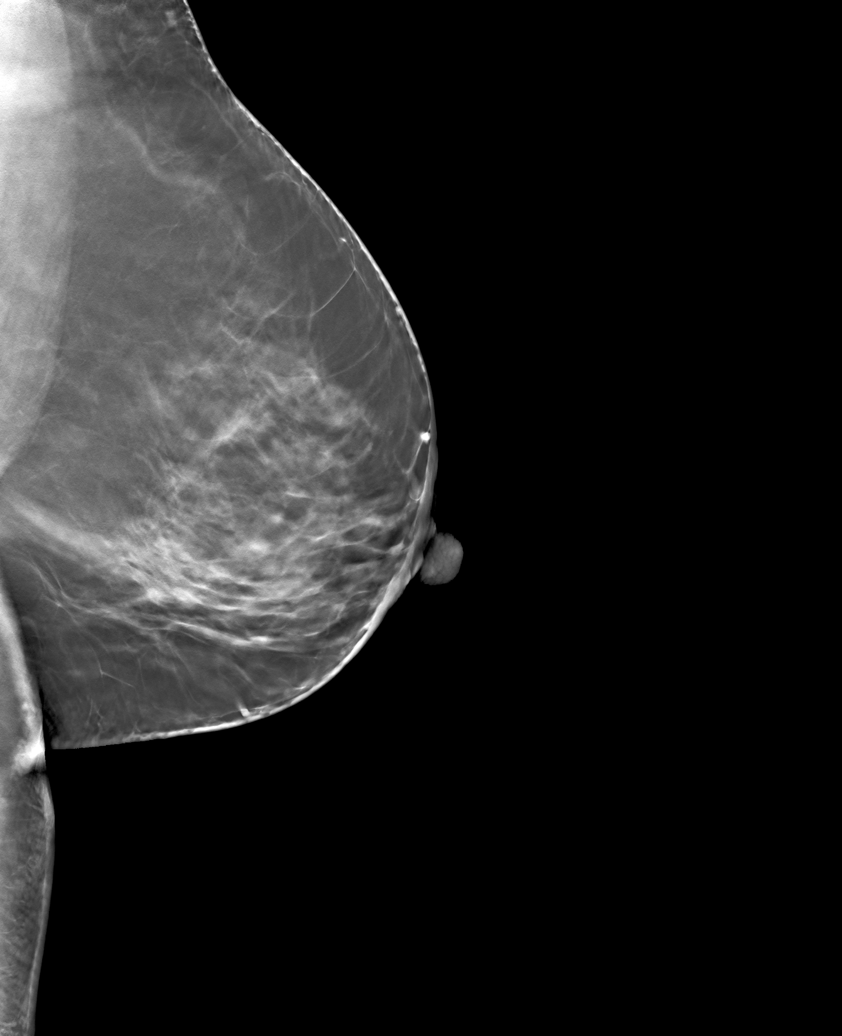

[6 of 30 positions shown; findings below may reference images not displayed]

ACR Breast Density Category c: The breast tissue is heterogeneously
dense, which may obscure small masses.
FINDINGS: There are no findings suspicious for malignancy.
IMPRESSION: No mammographic evidence of malignancy. A result letter of this
screening mammogram will be mailed directly to the patient.

RECOMMENDATION:
Screening mammogram in one year. (Code:Q3-W-BC3)

BI-RADS CATEGORY  1: Negative.

## 2022-02-15 ENCOUNTER — Encounter: Payer: Self-pay | Admitting: Nurse Practitioner

## 2022-02-15 ENCOUNTER — Ambulatory Visit (INDEPENDENT_AMBULATORY_CARE_PROVIDER_SITE_OTHER): Payer: 59 | Admitting: Nurse Practitioner

## 2022-02-15 VITALS — BP 128/74 | HR 63 | Ht 68.0 in | Wt 236.6 lb

## 2022-02-15 DIAGNOSIS — G4733 Obstructive sleep apnea (adult) (pediatric): Secondary | ICD-10-CM

## 2022-02-15 NOTE — Assessment & Plan Note (Signed)
Severe OSA on CPAP. She has been doing well with PAP therapy since starting. Download shows excellent compliance and good control of events. No significant leaks. She currently has a medium full face mask, which does appear to be a little large for her face. We will change her to a small full face and re-evaluate. She will let us know if she would like to go for a mask fitting, should the small not be comfortable. Encouraged her to continue using nightly and to try to increase number of hours of sleep a night. 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 Sleep hygiene reviewed. Cautioned on safe driving practices.   Patient Instructions  Continue to use CPAP every night, minimum of 4-6 hours a night.  Change equipment every 30 days or as directed by DME. Wash your tubing with warm soap and water daily, hang to dry. Wash humidifier portion weekly.  Be aware of reduced alertness and do not drive or operate heavy machinery if experiencing this or drowsiness.  Exercise encouraged, as tolerated. Notify if persistent daytime sleepiness occurs even with consistent use of CPAP.  Great job with your CPAP  I have sent orders for you to get a small fullface mask, new filters, new headstrap and a SD card to put in your machine as backup  Follow up in 6 months with Dr. Halford Chessman or Alanson Aly, or sooner, if needed

## 2022-02-15 NOTE — Progress Notes (Signed)
Reviewed and agree with assessment/plan.   Chesley Mires, MD [facility] Pulmonary/Critical Care 02/15/2022, 12:13 PM Pager:  [phone]

## 2022-02-15 NOTE — Patient Instructions (Addendum)
Continue to use CPAP every night, minimum of 4-6 hours a night.  Change equipment every 30 days or as directed by DME. Wash your tubing with warm soap and water daily, hang to dry. Wash humidifier portion weekly.  Be aware of reduced alertness and do not drive or operate heavy machinery if experiencing this or drowsiness.  Exercise encouraged, as tolerated. Notify if persistent daytime sleepiness occurs even with consistent use of CPAP.  Great job with your CPAP  I have sent orders for you to get a small fullface mask, new filters, new headstrap and a SD card to put in your machine as backup  Follow up in 6 months with Dr. Halford Chessman or Alanson Aly, or sooner, if needed

## 2022-02-15 NOTE — Progress Notes (Signed)
$@PatientK$  ID: Colleen Rowe, female    DOB: 1960/06/21, 62 y.o.   MRN: [id number]  Chief Complaint  Patient presents with   Follow-up    Pt f/u for OSA she states that she is getting good qualityc sleep but is interested in possibly doing a mask fitting    Referring provider: Lindell Spar, MD  HPI: 62 year old female, never smoker followed for severe obstructive sleep apnea. She is a patient of Dr. Juanetta Gosling and last seen in office on 08/25/2021. Past medical history significant for HTN, DM, seasonal allergies, obesity.    TEST/EVENTS:  04/20/2021 HST: AHI 49.5, SpO2 low 55%  10/27/2020: OV with Dr. Halford Chessman for sleep consult. Previously had a sleep study several years ago and told she had sleep apnea; opted against therapy at that time. Reported snoring more and stopping breathing when she sleeps. Works third shift. Falls asleep easily but wakes feeling tired. HST ordered.  04/30/2021: OV with Sachin Ferencz NP to discuss home sleep study results which showed severe OSA and significant oxygen desaturations. She works night shift from 7p-7a. On her off days, she still stays up at night and usually sleeps during the day. Wakes feeling tired and uses coffee to keep her up. She denies headaches upon waking, drowsy driving or narcolepsy. She is currently trying to lose weight and working towards a more healthy lifestyle.  08/25/2021: OV with Dr. Halford Chessman.  Still has not received CPAP so OV was canceled.   02/15/2022: Today - follow up Patient presents today for follow up after starting on CPAP daily. She has been doing well with this. She feels like she's getting benefit from use. Resting better and waking up feeling more refreshed. She usually gets 5-6 hours of sleep so still has some fatigue symptoms. She hasn't noticed any leaks for the most part. She does feel like her face mask is a little too large so she has to tighten it so much that it leaves indents on her face for a long time. She wants to know if she can  try a smaller size.   Allergies  Allergen Reactions   Benadryl [Diphenhydramine]     SOB    Immunization History  Administered Date(s) Administered   PPD Test 09/19/2020   Tdap 09/05/2020    Past Medical History:  Diagnosis Date   Allergies    Diabetes mellitus without complication ([place])    Hypertension     Tobacco History: Social History   Tobacco Use  Smoking Status Never  Smokeless Tobacco Never   Counseling given: Not Answered   Outpatient Medications Prior to Visit  Medication Sig Dispense Refill   acetaminophen (TYLENOL) 500 MG tablet Take 500-1,000 mg by mouth every 6 (six) hours as needed for moderate pain.     amLODipine (NORVASC) 10 MG tablet Take 1 tablet (10 mg total) by mouth daily. 90 tablet 1   blood glucose meter kit and supplies Dispense based on patient and insurance preference. Use up to four times daily as directed. (FOR ICD-10 E10.9, E11.9). 1 each 0   Calcium Carbonate Antacid (ALKA-SELTZER HEARTBURN PO) Take 2 tablets by mouth daily as needed (heartburn).     Cholecalciferol (VITAMIN D3) 50 MCG (2000 UT) TABS Take 2,000 Units by mouth daily.     diphenhydrAMINE (BENADRYL) 25 MG tablet Take 25 mg by mouth daily as needed for allergies.     ferrous gluconate (FERGON) 240 (27 FE) MG tablet Take 480 mg by mouth daily.  fexofenadine (ALLEGRA) 180 MG tablet Take 180 mg by mouth daily as needed for allergies or rhinitis.     metFORMIN (GLUCOPHAGE) 500 MG tablet Take 1 tablet (500 mg total) by mouth 2 (two) times daily with a meal. 180 tablet 1   olmesartan-hydrochlorothiazide (BENICAR HCT) 40-12.5 MG tablet Take 1 tablet by mouth daily. 90 tablet 1   Simethicone (GAS-X PO) Take 1 tablet by mouth daily as needed (gas).     Tetrahydrozoline HCl (VISINE OP) Place 1 drop into both eyes daily as needed (redness).     vitamin B-12 (CYANOCOBALAMIN) 500 MCG tablet Take 500 mcg by mouth daily.     No facility-administered medications prior to visit.      Review of Systems:   Constitutional: No weight loss or gain, night sweats, fevers, chills. +fatigue HEENT: No headaches, difficulty swallowing, tooth/dental problems, or sore throat. No sneezing, itching, ear ache, nasal congestion, or post nasal drip.  CV:  No chest pain, orthopnea, PND, swelling in lower extremities, anasarca, dizziness, palpitations, syncope Resp: No shortness of breath with exertion or at rest. No excess mucus or change in color of mucus. No productive or non-productive. No hemoptysis. No wheezing.  No chest wall deformity Skin: No rash, lesions, ulcerations MSK:  No joint pain or swelling.  No decreased range of motion.  No back pain. Neuro: No dizziness or lightheadedness.  Psych: No depression or anxiety. Mood stable.     Physical Exam:  BP 128/74   Pulse 63   Ht 5' 8"$  (1.727 m)   Wt 236 lb 9.6 oz (107.3 kg)   SpO2 99%   BMI 35.97 kg/m   GEN: Pleasant, interactive, well-appearing; obese; in no acute distress. HEENT:  Normocephalic and atraumatic. PERRLA. Sclera white. Nasal turbinates pink, moist and patent bilaterally. No rhinorrhea present. Oropharynx pink and moist, without exudate or edema. No lesions, ulcerations, or postnasal drip.  NECK:  Supple w/ fair ROM. No JVD present. Normal carotid impulses w/o bruits. Thyroid symmetrical with no goiter or nodules palpated. No lymphadenopathy.   CV: RRR, no m/r/g, no peripheral edema. Pulses intact, +2 bilaterally. No cyanosis, pallor or clubbing. PULMONARY:  Unlabored, regular breathing. Clear bilaterally A&P w/o wheezes/rales/rhonchi. No accessory muscle use. No dullness to percussion. GI: BS present and normoactive. Soft, non-tender to palpation.  MSK: No erythema, warmth or tenderness. Cap refil <2 sec all extrem. No deformities or joint swelling noted.  Neuro: A/Ox3. No focal deficits noted.   Skin: Warm, no lesions or rashe Psych: Normal affect and behavior. Judgement and thought content appropriate.      Lab Results:  CBC    Component Value Date/Time   WBC 9.5 09/09/2020 0811   WBC 9.0 03/28/2014 1722   RBC 4.77 09/09/2020 0811   RBC 4.63 03/28/2014 1722   HGB 12.8 09/09/2020 0811   HCT 39.6 09/09/2020 0811   PLT 235 09/09/2020 0811   MCV 83 09/09/2020 0811   MCH 26.8 09/09/2020 0811   MCH 26.8 03/28/2014 1722   MCHC 32.3 09/09/2020 0811   MCHC 32.2 03/28/2014 1722   RDW 12.5 09/09/2020 0811   LYMPHSABS 3.9 (H) 09/09/2020 0811   MONOABS 0.5 03/28/2014 1722   EOSABS 0.3 09/09/2020 0811   BASOSABS 0.0 09/09/2020 0811    BMET    Component Value Date/Time   NA 141 03/25/2021 0913   K 3.9 03/25/2021 0913   CL 103 03/25/2021 0913   CO2 24 03/25/2021 0913   GLUCOSE 162 (H) 03/25/2021 0913  GLUCOSE 179 (H) 03/28/2014 1722   BUN 9 03/25/2021 0913   CREATININE 0.94 03/25/2021 0913   CALCIUM 9.0 03/25/2021 0913   GFRNONAA 68 (L) 03/28/2014 1722   GFRAA 79 (L) 03/28/2014 1722    BNP No results found for: "BNP"   Imaging:  No results found.        No data to display          No results found for: "NITRICOXIDE"      Assessment & Plan:   OSA (obstructive sleep apnea) Severe OSA on CPAP. She has been doing well with PAP therapy since starting. Download shows excellent compliance and good control of events. No significant leaks. She currently has a medium full face mask, which does appear to be a little large for her face. We will change her to a small full face and re-evaluate. She will let us know if she would like to go for a mask fitting, should the small not be comfortable. Encouraged her to continue using nightly and to try to increase number of hours of sleep a night. 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 Sleep hygiene reviewed. Cautioned on safe driving practices.   Patient Instructions  Continue to use CPAP every night, minimum of 4-6 hours a night.  Change equipment every 30 days or as directed by DME.  Wash your tubing with warm soap and water daily, hang to dry. Wash humidifier portion weekly.  Be aware of reduced alertness and do not drive or operate heavy machinery if experiencing this or drowsiness.  Exercise encouraged, as tolerated. Notify if persistent daytime sleepiness occurs even with consistent use of CPAP.  Great job with your CPAP  I have sent orders for you to get a small fullface mask, new filters, new headstrap and a SD card to put in your machine as backup  Follow up in 6 months with Dr. Halford Chessman or Alanson Aly, or sooner, if needed     I spent 28 minutes of dedicated to the care of this patient on the date of this encounter to include pre-visit review of records, face-to-face time with the patient discussing conditions above, post visit ordering of testing, clinical documentation with the electronic health record, making appropriate referrals as documented, and communicating necessary findings to members of the patients care team.  Clayton Bibles, NP 02/15/2022  Pt aware and understands NP's role.

## 2022-03-02 ENCOUNTER — Ambulatory Visit: Payer: 59 | Admitting: Internal Medicine

## 2022-03-05 DIAGNOSIS — G4733 Obstructive sleep apnea (adult) (pediatric): Secondary | ICD-10-CM | POA: Diagnosis not present

## 2022-03-08 ENCOUNTER — Encounter: Payer: Self-pay | Admitting: Internal Medicine

## 2022-03-08 ENCOUNTER — Other Ambulatory Visit: Payer: Self-pay | Admitting: Internal Medicine

## 2022-03-08 ENCOUNTER — Ambulatory Visit: Payer: 59 | Admitting: Internal Medicine

## 2022-03-08 VITALS — BP 152/78 | HR 84 | Resp 14 | Ht 68.0 in | Wt 235.4 lb

## 2022-03-08 DIAGNOSIS — Z0001 Encounter for general adult medical examination with abnormal findings: Secondary | ICD-10-CM | POA: Diagnosis not present

## 2022-03-08 DIAGNOSIS — I10 Essential (primary) hypertension: Secondary | ICD-10-CM | POA: Diagnosis not present

## 2022-03-08 DIAGNOSIS — E559 Vitamin D deficiency, unspecified: Secondary | ICD-10-CM | POA: Diagnosis not present

## 2022-03-08 DIAGNOSIS — E1169 Type 2 diabetes mellitus with other specified complication: Secondary | ICD-10-CM

## 2022-03-08 DIAGNOSIS — E119 Type 2 diabetes mellitus without complications: Secondary | ICD-10-CM | POA: Diagnosis not present

## 2022-03-08 DIAGNOSIS — E782 Mixed hyperlipidemia: Secondary | ICD-10-CM | POA: Diagnosis not present

## 2022-03-08 DIAGNOSIS — G4733 Obstructive sleep apnea (adult) (pediatric): Secondary | ICD-10-CM

## 2022-03-08 MED ORDER — OZEMPIC (0.25 OR 0.5 MG/DOSE) 2 MG/3ML ~~LOC~~ SOPN: PEN_INJECTOR | SUBCUTANEOUS | 1 refills | Status: DC

## 2022-03-08 MED ORDER — LANCETS MISC. MISC: 1.0000 | Freq: Two times a day (BID) | 3 refills | Status: DC

## 2022-03-08 MED ORDER — BLOOD GLUCOSE TEST VI STRP: 1.0000 | ORAL_STRIP | Freq: Two times a day (BID) | 3 refills | Status: DC

## 2022-03-08 MED ORDER — TIRZEPATIDE 2.5 MG/0.5ML ~~LOC~~ SOAJ: 2.5000 mg | SUBCUTANEOUS | 2 refills | Status: DC

## 2022-03-08 NOTE — Assessment & Plan Note (Signed)
Lab Results  Component Value Date   HGBA1C 8.6 06/29/2021   HGBA1C 8.6 (A) 06/29/2021   Uncontrolled Associated with HTN, OSA and obesity on metformin, needs to be compliant Advised to follow diabetic diet Once she is compliant to current regimen, will start statin On ARB F/u CMP and lipid panel Diabetic eye exam: Advised to follow up with Ophthalmology for diabetic eye exam

## 2022-03-08 NOTE — Progress Notes (Unsigned)
Established Patient Office Visit  Subjective:  Patient ID: Colleen Rowe, female    DOB: Oct 29, 1960  Age: 62 y.o. MRN: [id number]  CC:  Chief Complaint  Patient presents with   Hypertension    4 mth follow up   Diabetes    HPI KAYLNN Rowe is a 62 y.o. female with past medical history of HTN, type II DM and morbid obesity who presents for f/u of her chronic medical conditions.  HTN: BP is well-controlled now. Takes medications regularly. Patient denies headache, dizziness, chest pain, dyspnea or palpitations.  Type II DM: Her HbA1c is still elevated at 8.6.  She has been taking metformin only QD instead of BID.  She denies any polyuria or polydipsia currently. She also reports that she needs to improve her diet.  She had sleep study done in the last week and is awaiting call for results of it for CPAP device.    Past Medical History:  Diagnosis Date   Allergies    Diabetes mellitus without complication ([place])    Hypertension     Past Surgical History:  Procedure Laterality Date   CHOLECYSTECTOMY     COLONOSCOPY WITH PROPOFOL N/A 10/21/2020   Procedure: COLONOSCOPY WITH PROPOFOL;  Surgeon: Eloise Harman, DO;  Location: [facility];  Service: Endoscopy;  Laterality: N/A;  9:00 / ASA II   DENTAL SURGERY      Family History  Problem Relation Age of Onset   Breast cancer Mother    Diabetes Mother    Hypertension Mother    Breast cancer Sister    Colon cancer Neg Hx     Social History   Socioeconomic History   Marital status: Divorced    Spouse name: Not on file   Number of children: 2   Years of education: Not on file   Highest education level: Not on file  Occupational History   Not on file  Tobacco Use   Smoking status: Never   Smokeless tobacco: Never  Vaping Use   Vaping Use: Never used  Substance and Sexual Activity   Alcohol use: Not Currently   Drug use: Never   Sexual activity: Not Currently    Birth control/protection: Post-menopausal   Other Topics Concern   Not on file  Social History Narrative   Not on file   Social Determinants of Health   Financial Resource Strain: Unknown (10/13/2020)   Overall Financial Resource Strain (CARDIA)    Difficulty of Paying Living Expenses: Patient refused  Food Insecurity: Unknown (10/13/2020)   Hunger Vital Sign    Worried About Running Out of Food in the Last Year: Patient refused    [place] in the Last Year: Patient refused  Transportation Needs: No Transportation Needs (10/13/2020)   PRAPARE - Hydrologist (Medical): No    Lack of Transportation (Non-Medical): No  Physical Activity: Sufficiently Active (10/13/2020)   Exercise Vital Sign    Days of Exercise per Week: 4 days    Minutes of Exercise per Session: 100 min  Stress: No Stress Concern Present (10/13/2020)   [place]    Feeling of Stress : Not at all  Social Connections: Unknown (10/13/2020)   Social Connection and Isolation Panel [NHANES]    Frequency of Communication with Friends and Family: Patient refused    Frequency of Social Gatherings with Friends and Family: Patient refused    Attends Religious Services:  Patient refused    Active Member of Clubs or Organizations: No    Attends Archivist Meetings: Never    Marital Status: Divorced  Human resources officer Violence: Not At Risk (10/13/2020)   Humiliation, Afraid, Rape, and Kick questionnaire    Fear of Current or Ex-Partner: No    Emotionally Abused: No    Physically Abused: No    Sexually Abused: No    Outpatient Medications Prior to Visit  Medication Sig Dispense Refill   acetaminophen (TYLENOL) 500 MG tablet Take 500-1,000 mg by mouth every 6 (six) hours as needed for moderate pain.     amLODipine (NORVASC) 10 MG tablet Take 1 tablet (10 mg total) by mouth daily. 90 tablet 1   blood glucose meter kit and supplies Dispense based on patient  and insurance preference. Use up to four times daily as directed. (FOR ICD-10 E10.9, E11.9). 1 each 0   Calcium Carbonate Antacid (ALKA-SELTZER HEARTBURN PO) Take 2 tablets by mouth daily as needed (heartburn).     Cholecalciferol (VITAMIN D3) 50 MCG (2000 UT) TABS Take 2,000 Units by mouth daily.     ferrous gluconate (FERGON) 240 (27 FE) MG tablet Take 480 mg by mouth daily.     fexofenadine (ALLEGRA) 180 MG tablet Take 180 mg by mouth daily as needed for allergies or rhinitis.     metFORMIN (GLUCOPHAGE) 500 MG tablet Take 1 tablet (500 mg total) by mouth 2 (two) times daily with a meal. 180 tablet 1   olmesartan-hydrochlorothiazide (BENICAR HCT) 40-12.5 MG tablet Take 1 tablet by mouth daily. 90 tablet 1   Simethicone (GAS-X PO) Take 1 tablet by mouth daily as needed (gas).     Tetrahydrozoline HCl (VISINE OP) Place 1 drop into both eyes daily as needed (redness).     vitamin B-12 (CYANOCOBALAMIN) 500 MCG tablet Take 500 mcg by mouth daily.     diphenhydrAMINE (BENADRYL) 25 MG tablet Take 25 mg by mouth daily as needed for allergies. (Patient not taking: Reported on 03/08/2022)     No facility-administered medications prior to visit.    Allergies  Allergen Reactions   Benadryl [Diphenhydramine]     SOB    ROS Review of Systems  Constitutional:  Negative for chills and fever.  HENT:  Negative for congestion, sinus pressure, sinus pain and sore throat.   Eyes:  Negative for pain and discharge.  Respiratory:  Positive for apnea. Negative for cough and shortness of breath.   Cardiovascular:  Positive for leg swelling. Negative for chest pain and palpitations.  Gastrointestinal:  Negative for abdominal pain, constipation, diarrhea, nausea and vomiting.  Endocrine: Negative for polydipsia and polyuria.  Genitourinary:  Negative for dysuria and hematuria.  Musculoskeletal:  Negative for neck pain and neck stiffness.  Skin:  Negative for rash.  Neurological:  Negative for dizziness and  weakness.  Psychiatric/Behavioral:  Negative for agitation and behavioral problems.       Objective:    Physical Exam Vitals reviewed.  Constitutional:      General: She is not in acute distress.    Appearance: She is not diaphoretic.  HENT:     Head: Normocephalic and atraumatic.     Nose: Nose normal.     Mouth/Throat:     Mouth: Mucous membranes are moist.  Eyes:     General: No scleral icterus.    Extraocular Movements: Extraocular movements intact.  Cardiovascular:     Rate and Rhythm: Normal rate and regular rhythm.  Pulses: Normal pulses.     Heart sounds: Normal heart sounds. No murmur heard. Pulmonary:     Breath sounds: Normal breath sounds. No wheezing or rales.  Musculoskeletal:     Cervical back: Neck supple. No tenderness.     Right lower leg: No edema.     Left lower leg: No edema.  Feet:     Left foot:     Skin integrity: Dry skin present.  Skin:    General: Skin is warm.     Findings: No rash.  Neurological:     General: No focal deficit present.     Mental Status: She is alert and oriented to person, place, and time.     Sensory: No sensory deficit.     Motor: No weakness.  Psychiatric:        Mood and Affect: Mood normal.        Behavior: Behavior normal.     BP (!) 166/85   Pulse 84   Resp 14   Ht '5\' 8"'$  (1.727 m)   Wt 235 lb 6.4 oz (106.8 kg)   SpO2 97%   BMI 35.79 kg/m  Wt Readings from Last 3 Encounters:  03/08/22 235 lb 6.4 oz (106.8 kg)  02/15/22 236 lb 9.6 oz (107.3 kg)  10/27/21 222 lb 6.4 oz (100.9 kg)    Lab Results  Component Value Date   TSH 1.920 09/09/2020   Lab Results  Component Value Date   WBC 9.5 09/09/2020   HGB 12.8 09/09/2020   HCT 39.6 09/09/2020   MCV 83 09/09/2020   PLT 235 09/09/2020   Lab Results  Component Value Date   NA 141 03/25/2021   K 3.9 03/25/2021   CO2 24 03/25/2021   GLUCOSE 162 (H) 03/25/2021   BUN 9 03/25/2021   CREATININE 0.94 03/25/2021   BILITOT <0.2 03/25/2021   ALKPHOS  115 03/25/2021   AST 35 03/25/2021   ALT 37 (H) 03/25/2021   PROT 7.2 03/25/2021   ALBUMIN 4.2 03/25/2021   CALCIUM 9.0 03/25/2021   ANIONGAP 9 03/28/2014   EGFR 69 03/25/2021   Lab Results  Component Value Date   CHOL 131 03/25/2021   Lab Results  Component Value Date   HDL 41 03/25/2021   Lab Results  Component Value Date   LDLCALC 65 03/25/2021   Lab Results  Component Value Date   TRIG 142 03/25/2021   Lab Results  Component Value Date   CHOLHDL 3.2 03/25/2021   Lab Results  Component Value Date   HGBA1C 8.6 06/29/2021   HGBA1C 8.6 (A) 06/29/2021      Assessment & Plan:   Problem List Items Addressed This Visit   None  No orders of the defined types were placed in this encounter.   Follow-up: No follow-ups on file.    Lindell Spar, MD

## 2022-03-08 NOTE — Patient Instructions (Signed)
Please start taking Ozempic as prescribed.  Please continue taking medications as prescribed.  Please continue to follow low carb diet and perform moderate exercise/walking at least 150 mins/week.

## 2022-03-09 ENCOUNTER — Telehealth: Payer: Self-pay | Admitting: Internal Medicine

## 2022-03-09 LAB — CMP14+EGFR
ALT: 32 IU/L (ref 0–32)
AST: 31 IU/L (ref 0–40)
Albumin/Globulin Ratio: 1.2 (ref 1.2–2.2)
Albumin: 3.9 g/dL (ref 3.9–4.9)
Alkaline Phosphatase: 171 IU/L — ABNORMAL HIGH (ref 44–121)
BUN/Creatinine Ratio: 9 — ABNORMAL LOW (ref 12–28)
BUN: 8 mg/dL (ref 8–27)
Bilirubin Total: 0.3 mg/dL (ref 0.0–1.2)
CO2: 19 mmol/L — ABNORMAL LOW (ref 20–29)
Calcium: 9.3 mg/dL (ref 8.7–10.3)
Chloride: 101 mmol/L (ref 96–106)
Creatinine, Ser: 0.9 mg/dL (ref 0.57–1.00)
Globulin, Total: 3.3 g/dL (ref 1.5–4.5)
Glucose: 285 mg/dL — ABNORMAL HIGH (ref 70–99)
Potassium: 4.3 mmol/L (ref 3.5–5.2)
Sodium: 140 mmol/L (ref 134–144)
Total Protein: 7.2 g/dL (ref 6.0–8.5)
eGFR: 72 mL/min/{1.73_m2} (ref >59)

## 2022-03-09 LAB — CBC WITH DIFFERENTIAL/PLATELET
Basophils Absolute: 0.1 10*3/uL (ref 0.0–0.2)
Basos: 1 %
EOS (ABSOLUTE): 0.2 10*3/uL (ref 0.0–0.4)
Eos: 3 %
Hematocrit: 41.3 % (ref 34.0–46.6)
Hemoglobin: 13.1 g/dL (ref 11.1–15.9)
Immature Grans (Abs): 0 10*3/uL (ref 0.0–0.1)
Immature Granulocytes: 0 %
Lymphocytes Absolute: 3 10*3/uL (ref 0.7–3.1)
Lymphs: 39 %
MCH: 26.1 pg — ABNORMAL LOW (ref 26.6–33.0)
MCHC: 31.7 g/dL (ref 31.5–35.7)
MCV: 82 fL (ref 79–97)
Monocytes Absolute: 0.5 10*3/uL (ref 0.1–0.9)
Monocytes: 6 %
Neutrophils Absolute: 4 10*3/uL (ref 1.4–7.0)
Neutrophils: 51 %
Platelets: 197 10*3/uL (ref 150–450)
RBC: 5.02 x10E6/uL (ref 3.77–5.28)
RDW: 12.5 % (ref 11.7–15.4)
WBC: 7.8 10*3/uL (ref 3.4–10.8)

## 2022-03-09 LAB — LIPID PANEL
Chol/HDL Ratio: 3.4 ratio (ref 0.0–4.4)
Cholesterol, Total: 133 mg/dL (ref 100–199)
HDL: 39 mg/dL — ABNORMAL LOW (ref >39)
LDL Chol Calc (NIH): 65 mg/dL (ref 0–99)
Triglycerides: 172 mg/dL — ABNORMAL HIGH (ref 0–149)
VLDL Cholesterol Cal: 29 mg/dL (ref 5–40)

## 2022-03-09 LAB — VITAMIN D 25 HYDROXY (VIT D DEFICIENCY, FRACTURES): Vit D, 25-Hydroxy: 26.9 ng/mL — ABNORMAL LOW (ref 30.0–100.0)

## 2022-03-09 LAB — TSH: TSH: 0.588 u[IU]/mL (ref 0.450–4.500)

## 2022-03-09 LAB — HEMOGLOBIN A1C
Est. average glucose Bld gHb Est-mCnc: 226 mg/dL
Hgb A1c MFr Bld: 9.5 % — ABNORMAL HIGH (ref 4.8–5.6)

## 2022-03-09 NOTE — Assessment & Plan Note (Signed)
Uses CPAP regularly °

## 2022-03-09 NOTE — Assessment & Plan Note (Signed)
BMI Readings from Last 3 Encounters:  03/08/22 35.79 kg/m  02/15/22 35.97 kg/m  10/27/21 33.82 kg/m   Associated with HTN and DM Advised to follow low carb diet and perform moderate exercise/walking at least 150 mins/week Added Mounjaro for type 2 DM

## 2022-03-09 NOTE — Assessment & Plan Note (Signed)
BP Readings from Last 1 Encounters:  03/08/22 (!) 152/78   Uncontrolled with Olmesartan-HCTZ  and Amlodipine Compliance questionable, reports that she had high salt diet today - would avoid changing regimen for now Counseled for compliance with the medications Advised DASH diet and moderate exercise/walking, at least 150 mins/week

## 2022-03-09 NOTE — Telephone Encounter (Signed)
Patient returning lab call 

## 2022-03-09 NOTE — Progress Notes (Signed)
Returned call

## 2022-03-09 NOTE — Telephone Encounter (Signed)
Spoke to patient

## 2022-03-11 ENCOUNTER — Other Ambulatory Visit: Payer: Self-pay | Admitting: Internal Medicine

## 2022-03-11 ENCOUNTER — Telehealth: Payer: Self-pay | Admitting: Internal Medicine

## 2022-03-11 DIAGNOSIS — E1169 Type 2 diabetes mellitus with other specified complication: Secondary | ICD-10-CM

## 2022-03-11 MED ORDER — OZEMPIC (0.25 OR 0.5 MG/DOSE) 2 MG/3ML ~~LOC~~ SOPN: PEN_INJECTOR | SUBCUTANEOUS | 1 refills | Status: DC

## 2022-03-11 NOTE — Telephone Encounter (Signed)
Patient called in regard to tirzepatide [facility]) 2.5 MG/0.5ML Pen  Patient insurance does not cover medicine. Wants an alternative sent in. Patient wants a call back in regard.   Patient also needs refill on Lancets Misc. [place]  [id number] [place] [address] [id number]: [phone] Fax: [phone] #: [id number]   If patient does not answer please leave voicemail.

## 2022-03-11 NOTE — Telephone Encounter (Signed)
Patient advised.

## 2022-03-16 ENCOUNTER — Telehealth: Payer: Self-pay | Admitting: Internal Medicine

## 2022-03-16 ENCOUNTER — Other Ambulatory Visit: Payer: Self-pay

## 2022-03-16 DIAGNOSIS — I10 Essential (primary) hypertension: Secondary | ICD-10-CM

## 2022-03-16 MED ORDER — OLMESARTAN MEDOXOMIL-HCTZ 40-12.5 MG PO TABS: 1.0000 | ORAL_TABLET | Freq: Every day | ORAL | 1 refills | Status: DC

## 2022-03-16 MED ORDER — AMLODIPINE BESYLATE 10 MG PO TABS: 10.0000 mg | ORAL_TABLET | Freq: Every day | ORAL | 1 refills | Status: DC

## 2022-03-16 NOTE — Telephone Encounter (Signed)
Prescription Request  03/16/2022  LOV: 03/08/2022  What is the name of the medication or equipment? amLODipine (NORVASC) 10 MG tablet [id number]   olmesartan-hydrochlorothiazide (BENICAR HCT) 40-12.5 MG tablet [id number]   Have you contacted your pharmacy to request a refill? Yes   Which pharmacy would you like this sent to?  [facility]   Patient notified that their request is being sent to the clinical staff for review and that they should receive a response within 2 business days.   Please advise at [place]

## 2022-03-16 NOTE — Telephone Encounter (Signed)
Spoke to patient

## 2022-03-16 NOTE — Telephone Encounter (Signed)
Patient came by the office and said the pharmacy could not fill the medication per patient it's the shot that she gives her self does not know name of it, she said it's done once a week. [facility]acy: [facility]

## 2022-03-16 NOTE — Telephone Encounter (Signed)
Refills sent

## 2022-04-06 DIAGNOSIS — G4733 Obstructive sleep apnea (adult) (pediatric): Secondary | ICD-10-CM | POA: Diagnosis not present

## 2022-04-09 ENCOUNTER — Telehealth: Payer: Self-pay | Admitting: Internal Medicine

## 2022-04-09 NOTE — Telephone Encounter (Signed)
Patient called said she went to [place] and now can not find her medicine since she has gotten back into town.  Can provider call the pharmacy to refill her medication  amLODipine (NORVASC) 10 MG tablet [[id number]   metFORMIN (GLUCOPHAGE) 500 MG tablet [[id number]]   olmesartan-hydrochlorothiazide (BENICAR HCT) 40-12.5 MG tablet [[id number]]   Semaglutide,0.25 or 0.5MG /DOS, (OZEMPIC, 0.25 OR 0.5 MG/DOSE,) 2 MG/3ML SOPN [[id number]]   Ph[facility]acy: [facility]

## 2022-04-09 NOTE — Telephone Encounter (Signed)
Patient phone is turned off, will try back monday

## 2022-04-09 NOTE — Telephone Encounter (Signed)
Pt called stating she is wanting to speak to nurse about medication    Pt phone turns off at 9am due to her sleeping for working 3rd shift

## 2022-04-12 ENCOUNTER — Other Ambulatory Visit: Payer: Self-pay

## 2022-04-12 DIAGNOSIS — I10 Essential (primary) hypertension: Secondary | ICD-10-CM

## 2022-04-12 DIAGNOSIS — E119 Type 2 diabetes mellitus without complications: Secondary | ICD-10-CM

## 2022-04-12 DIAGNOSIS — E1169 Type 2 diabetes mellitus with other specified complication: Secondary | ICD-10-CM

## 2022-04-12 MED ORDER — METFORMIN HCL 500 MG PO TABS: 500.0000 mg | ORAL_TABLET | Freq: Two times a day (BID) | ORAL | 1 refills | Status: DC

## 2022-04-12 MED ORDER — OLMESARTAN MEDOXOMIL-HCTZ 40-12.5 MG PO TABS: 1.0000 | ORAL_TABLET | Freq: Every day | ORAL | 1 refills | Status: DC

## 2022-04-12 MED ORDER — AMLODIPINE BESYLATE 10 MG PO TABS: 10.0000 mg | ORAL_TABLET | Freq: Every day | ORAL | 1 refills | Status: DC

## 2022-04-12 MED ORDER — OZEMPIC (0.25 OR 0.5 MG/DOSE) 2 MG/3ML ~~LOC~~ SOPN: PEN_INJECTOR | SUBCUTANEOUS | 1 refills | Status: DC

## 2022-04-12 NOTE — Telephone Encounter (Signed)
Refills sent to pharmacy. 

## 2022-05-06 DIAGNOSIS — G4733 Obstructive sleep apnea (adult) (pediatric): Secondary | ICD-10-CM | POA: Diagnosis not present

## 2022-05-10 ENCOUNTER — Ambulatory Visit: Payer: 59 | Admitting: Internal Medicine

## 2022-05-10 ENCOUNTER — Encounter: Payer: Self-pay | Admitting: Internal Medicine

## 2022-05-10 VITALS — BP 152/82 | HR 80 | Ht 68.0 in | Wt 229.6 lb

## 2022-05-10 DIAGNOSIS — E119 Type 2 diabetes mellitus without complications: Secondary | ICD-10-CM

## 2022-05-10 DIAGNOSIS — E1169 Type 2 diabetes mellitus with other specified complication: Secondary | ICD-10-CM | POA: Diagnosis not present

## 2022-05-10 DIAGNOSIS — I10 Essential (primary) hypertension: Secondary | ICD-10-CM

## 2022-05-10 DIAGNOSIS — E782 Mixed hyperlipidemia: Secondary | ICD-10-CM

## 2022-05-10 DIAGNOSIS — Z7984 Long term (current) use of oral hypoglycemic drugs: Secondary | ICD-10-CM | POA: Diagnosis not present

## 2022-05-10 MED ORDER — GLIPIZIDE ER 5 MG PO TB24: 5.0000 mg | ORAL_TABLET | Freq: Every day | ORAL | 0 refills | Status: DC

## 2022-05-10 MED ORDER — OLMESARTAN MEDOXOMIL-HCTZ 40-25 MG PO TABS: 1.0000 | ORAL_TABLET | Freq: Every day | ORAL | 3 refills | Status: DC

## 2022-05-10 NOTE — Progress Notes (Signed)
Established Patient Office Visit  Subjective:  Patient ID: Colleen Rowe, female    DOB: 1960-02-13  Age: 63 y.o. MRN: [id number]  CC:  Chief Complaint  Patient presents with   Diabetes    Two month follow up for diabetes and hypertension     HPI Colleen Rowe is a 62 y.o. female with past medical history of HTN, type II DM and morbid obesity who presents for f/u of her chronic medical conditions.  HTN: BP is elevated today. She reports taking medicines regularly,. Patient denies headache, dizziness, chest pain, dyspnea or palpitations.  Type II DM: Her HbA1c was elevated at 9.5 in 03/24.  She has been taking metformin BID, but compliance is questionable.  She denies any polyuria or polydipsia currently. She also reports that she needs to improve her diet. She has been taking at least 3 soft drinks in a day.  She has been using CPAP device regularly now.    Past Medical History:  Diagnosis Date   Allergies    Diabetes mellitus without complication (HCC)    Hypertension     Past Surgical History:  Procedure Laterality Date   CHOLECYSTECTOMY     COLONOSCOPY WITH PROPOFOL N/A 10/21/2020   Procedure: COLONOSCOPY WITH PROPOFOL;  Surgeon: Lanelle Bal, DO;  Location: [facility];  Service: Endoscopy;  Laterality: N/A;  9:00 / ASA II   DENTAL SURGERY      Family History  Problem Relation Age of Onset   Breast cancer Mother    Diabetes Mother    Hypertension Mother    Breast cancer Sister    Colon cancer Neg Hx     Social History   Socioeconomic History   Marital status: Divorced    Spouse name: Not on file   Number of children: 2   Years of education: Not on file   Highest education level: Not on file  Occupational History   Not on file  Tobacco Use   Smoking status: Never   Smokeless tobacco: Never  Vaping Use   Vaping Use: Never used  Substance and Sexual Activity   Alcohol use: Not Currently   Drug use: Never   Sexual activity: Not Currently     Birth control/protection: Post-menopausal  Other Topics Concern   Not on file  Social History Narrative   Not on file   Social Determinants of Health   Financial Resource Strain: Unknown (10/13/2020)   Overall Financial Resource Strain (CARDIA)    Difficulty of Paying Living Expenses: Patient declined  Food Insecurity: Unknown (10/13/2020)   Hunger Vital Sign    Worried About Running Out of Food in the Last Year: Patient declined    Ran Out of Food in the Last Year: Patient declined  Transportation Needs: No Transportation Needs (10/13/2020)   PRAPARE - Administrator, Civil Service (Medical): No    Lack of Transportation (Non-Medical): No  Physical Activity: Sufficiently Active (10/13/2020)   Exercise Vital Sign    Days of Exercise per Week: 4 days    Minutes of Exercise per Session: 100 min  Stress: No Stress Concern Present (10/13/2020)   [employer] of Occupational Health - Occupational Stress Questionnaire    Feeling of Stress : Not at all  Social Connections: Unknown (10/13/2020)   Social Connection and Isolation Panel [NHANES]    Frequency of Communication with Friends and Family: Patient declined    Frequency of Social Gatherings with Friends and Family: Patient declined  Attends Religious Services: Patient declined    Active Member of Clubs or Organizations: No    Attends Banker Meetings: Never    Marital Status: Divorced  Catering manager Violence: Not At Risk (10/13/2020)   Humiliation, Afraid, Rape, and Kick questionnaire    Fear of Current or Ex-Partner: No    Emotionally Abused: No    Physically Abused: No    Sexually Abused: No    Outpatient Medications Prior to Visit  Medication Sig Dispense Refill   acetaminophen (TYLENOL) 500 MG tablet Take 500-1,000 mg by mouth every 6 (six) hours as needed for moderate pain.     amLODipine (NORVASC) 10 MG tablet Take 1 tablet (10 mg total) by mouth daily. 90 tablet 1   blood glucose  meter kit and supplies Dispense based on patient and insurance preference. Use up to four times daily as directed. (FOR ICD-10 E10.9, E11.9). 1 each 0   Calcium Carbonate Antacid (ALKA-SELTZER HEARTBURN PO) Take 2 tablets by mouth daily as needed (heartburn).     Cholecalciferol (VITAMIN D3) 50 MCG (2000 UT) TABS Take 2,000 Units by mouth daily.     ferrous gluconate (FERGON) 240 (27 FE) MG tablet Take 480 mg by mouth daily.     fexofenadine (ALLEGRA) 180 MG tablet Take 180 mg by mouth daily as needed for allergies or rhinitis.     metFORMIN (GLUCOPHAGE) 500 MG tablet Take 1 tablet (500 mg total) by mouth 2 (two) times daily with a meal. 180 tablet 1   Simethicone (GAS-X PO) Take 1 tablet by mouth daily as needed (gas).     Tetrahydrozoline HCl (VISINE OP) Place 1 drop into both eyes daily as needed (redness).     vitamin B-12 (CYANOCOBALAMIN) 500 MCG tablet Take 500 mcg by mouth daily.     Glucose Blood (BLOOD GLUCOSE TEST STRIPS) STRP 1 each by In Vitro route 2 (two) times daily. May substitute to any manufacturer covered by patient's insurance. 100 strip 3   Lancets Misc. MISC 1 each by Does not apply route 2 (two) times daily. May substitute to any manufacturer covered by patient's insurance. 100 each 3   olmesartan-hydrochlorothiazide (BENICAR HCT) 40-12.5 MG tablet Take 1 tablet by mouth daily. 90 tablet 1   Semaglutide,0.25 or 0.5MG /DOS, (OZEMPIC, 0.25 OR 0.5 MG/DOSE,) 2 MG/3ML SOPN Inject 0.25 mg into the skin every 7 (seven) days for 28 days, THEN 0.5 mg every 7 (seven) days. 3 mL 1   No facility-administered medications prior to visit.    Allergies  Allergen Reactions   Benadryl [Diphenhydramine]     SOB    ROS Review of Systems  Constitutional:  Negative for chills and fever.  HENT:  Negative for congestion, sinus pressure, sinus pain and sore throat.   Eyes:  Negative for pain and discharge.  Respiratory:  Negative for cough and shortness of breath.   Cardiovascular:   Positive for leg swelling. Negative for chest pain and palpitations.  Gastrointestinal:  Negative for abdominal pain, constipation, diarrhea, nausea and vomiting.  Endocrine: Negative for polydipsia and polyuria.  Genitourinary:  Negative for dysuria and hematuria.  Musculoskeletal:  Negative for neck pain and neck stiffness.  Skin:  Negative for rash.  Neurological:  Negative for dizziness and weakness.  Psychiatric/Behavioral:  Negative for agitation and behavioral problems.       Objective:    Physical Exam Vitals reviewed.  Constitutional:      General: She is not in acute distress.    Appearance:  She is not diaphoretic.  HENT:     Head: Normocephalic and atraumatic.     Nose: Nose normal.     Mouth/Throat:     Mouth: Mucous membranes are moist.  Eyes:     General: No scleral icterus.    Extraocular Movements: Extraocular movements intact.  Cardiovascular:     Rate and Rhythm: Normal rate and regular rhythm.     Pulses: Normal pulses.     Heart sounds: Normal heart sounds. No murmur heard. Pulmonary:     Breath sounds: Normal breath sounds. No wheezing or rales.  Musculoskeletal:     Cervical back: Neck supple. No tenderness.     Right lower leg: No edema.     Left lower leg: No edema.  Feet:     Left foot:     Skin integrity: Dry skin present.  Skin:    General: Skin is warm.     Findings: No rash.  Neurological:     General: No focal deficit present.     Mental Status: She is alert and oriented to person, place, and time.     Sensory: No sensory deficit.     Motor: No weakness.  Psychiatric:        Mood and Affect: Mood normal.        Behavior: Behavior normal.     BP (!) 152/82 (BP Location: Right Arm)   Pulse 80   Ht 5\' 8"  (1.727 m)   Wt 229 lb 9.6 oz (104.1 kg)   SpO2 94%   BMI 34.91 kg/m  Wt Readings from Last 3 Encounters:  05/10/22 229 lb 9.6 oz (104.1 kg)  03/08/22 235 lb 6.4 oz (106.8 kg)  02/15/22 236 lb 9.6 oz (107.3 kg)    Lab Results   Component Value Date   TSH 0.588 03/08/2022   Lab Results  Component Value Date   WBC 7.8 03/08/2022   HGB 13.1 03/08/2022   HCT 41.3 03/08/2022   MCV 82 03/08/2022   PLT 197 03/08/2022   Lab Results  Component Value Date   NA 140 03/08/2022   K 4.3 03/08/2022   CO2 19 (L) 03/08/2022   GLUCOSE 285 (H) 03/08/2022   BUN 8 03/08/2022   CREATININE 0.90 03/08/2022   BILITOT 0.3 03/08/2022   ALKPHOS 171 (H) 03/08/2022   AST 31 03/08/2022   ALT 32 03/08/2022   PROT 7.2 03/08/2022   ALBUMIN 3.9 03/08/2022   CALCIUM 9.3 03/08/2022   ANIONGAP 9 03/28/2014   EGFR 72 03/08/2022   Lab Results  Component Value Date   CHOL 133 03/08/2022   Lab Results  Component Value Date   HDL 39 (L) 03/08/2022   Lab Results  Component Value Date   LDLCALC 65 03/08/2022   Lab Results  Component Value Date   TRIG 172 (H) 03/08/2022   Lab Results  Component Value Date   CHOLHDL 3.4 03/08/2022   Lab Results  Component Value Date   HGBA1C 9.5 (H) 03/08/2022      Assessment & Plan:   Problem List Items Addressed This Visit       Cardiovascular and Mediastinum   Essential hypertension - Primary    BP Readings from Last 1 Encounters:  05/10/22 (!) 152/82  Uncontrolled with Olmesartan-HCTZ  and Amlodipine Increased dose of olmesartan-HCTZ to 40-25 mg daily Counseled for compliance with the medications Advised DASH diet and moderate exercise/walking, at least 150 mins/week Needs to cut down soft drink intake  Relevant Medications   olmesartan-hydrochlorothiazide (BENICAR HCT) 40-25 MG tablet   Other Relevant Orders   CMP14+EGFR     Endocrine   Type 2 diabetes mellitus with other specified complication (HCC)    Lab Results  Component Value Date   HGBA1C 9.5 (H) 03/08/2022  Uncontrolled Associated with HTN, OSA and obesity on metformin, needs to be compliant Tried to start GLP-1 agonist, but Ozempic and Mounjaro were not covered Added glipizide 5 mg daily for  now Advised to follow diabetic diet -needs to cut down soft drink intake Once she is compliant to current regimen, will start statin -currently lipid profile showed LDL less than 70, On [facility] F/u CMP and lipid panel Diabetic eye exam: Advised to follow up with Ophthalmology for diabetic eye exam      Relevant Medications   glipiZIDE (GLUCOTROL XL) 5 MG 24 hr tablet   olmesartan-hydrochlorothiazide (BENICAR HCT) 40-25 MG tablet   Other Relevant Orders   Hemoglobin A1C     Other   Morbid obesity (HCC)    BMI Readings from Last 3 Encounters:  05/10/22 34.91 kg/m  03/08/22 35.79 kg/m  02/15/22 35.97 kg/m  Associated with HTN and DM Advised to follow low carb diet and perform moderate exercise/walking at least 150 mins/week She is a good candidate for GLP-1 agonist therapy, but Ozempic and Mounjaro was not covered      Relevant Medications   glipiZIDE (GLUCOTROL XL) 5 MG 24 hr tablet   Mixed hyperlipidemia    Last lipid profile showed LDL less than 70 Advised to follow low-carb, low cholesterol diet for now      Relevant Medications   olmesartan-hydrochlorothiazide (BENICAR HCT) 40-25 MG tablet   Other Visit Diagnoses     Type 2 diabetes mellitus without complication, without long-term current use of insulin (HCC)       Relevant Medications   glipiZIDE (GLUCOTROL XL) 5 MG 24 hr tablet   olmesartan-hydrochlorothiazide (BENICAR HCT) 40-25 MG tablet      Meds ordered this encounter  Medications   glipiZIDE (GLUCOTROL XL) 5 MG 24 hr tablet    Sig: Take 1 tablet (5 mg total) by mouth daily with breakfast.    Dispense:  90 tablet    Refill:  0   olmesartan-hydrochlorothiazide (BENICAR HCT) 40-25 MG tablet    Sig: Take 1 tablet by mouth daily.    Dispense:  30 tablet    Refill:  3    DOSE CHANGE - 05/10/22    Follow-up: Return in about 2 months (around 07/10/2022) for DM and HTN.    Anabel Halon, MD

## 2022-05-10 NOTE — Assessment & Plan Note (Signed)
BP Readings from Last 1 Encounters:  05/10/22 (!) 152/82   Uncontrolled with Olmesartan-HCTZ  and Amlodipine Increased dose of olmesartan-HCTZ to 40-25 mg daily Counseled for compliance with the medications Advised DASH diet and moderate exercise/walking, at least 150 mins/week Needs to cut down soft drink intake

## 2022-05-10 NOTE — Assessment & Plan Note (Signed)
Last lipid profile showed LDL less than 70 Advised to follow low-carb, low cholesterol diet for now

## 2022-05-10 NOTE — Patient Instructions (Signed)
Please start taking Olmesartan-HCTZ 40-25 mg once daily and Amlodipine 10 mg once daily.  Please start taking Glipizide once daily and continue taking Metformin for diabetes.  Please continue to follow low carb diet and perform moderate exercise/walking at least 150 mins/week.  Please get fasting blood tests done before the next visit.

## 2022-05-10 NOTE — Assessment & Plan Note (Signed)
BMI Readings from Last 3 Encounters:  05/10/22 34.91 kg/m  03/08/22 35.79 kg/m  02/15/22 35.97 kg/m   Associated with HTN and DM Advised to follow low carb diet and perform moderate exercise/walking at least 150 mins/week She is a good candidate for GLP-1 agonist therapy, but Ozempic and Mounjaro was not covered

## 2022-05-10 NOTE — Assessment & Plan Note (Addendum)
Lab Results  Component Value Date   HGBA1C 9.5 (H) 03/08/2022   Uncontrolled Associated with HTN, OSA and obesity on metformin, needs to be compliant Tried to start GLP-1 agonist, but Ozempic and Mounjaro were not covered Added glipizide 5 mg daily for now Advised to follow diabetic diet -needs to cut down soft drink intake Once she is compliant to current regimen, will start statin -currently lipid profile showed LDL less than 70, On ARB F/u CMP and lipid panel Diabetic eye exam: Advised to follow up with Ophthalmology for diabetic eye exam

## 2022-06-04 ENCOUNTER — Ambulatory Visit: Payer: 59 | Admitting: Family Medicine

## 2022-06-04 ENCOUNTER — Encounter: Payer: Self-pay | Admitting: Family Medicine

## 2022-06-04 ENCOUNTER — Ambulatory Visit (HOSPITAL_COMMUNITY)
Admission: RE | Admit: 2022-06-04 | Discharge: 2022-06-04 | Disposition: A | Discharge status: Home / Self Care | Payer: 59 | Source: Ambulatory Visit | Attending: Family Medicine | Admitting: Family Medicine

## 2022-06-04 VITALS — BP 172/88 | HR 71 | Ht 68.0 in | Wt 228.0 lb

## 2022-06-04 DIAGNOSIS — M1711 Unilateral primary osteoarthritis, right knee: Secondary | ICD-10-CM | POA: Diagnosis not present

## 2022-06-04 DIAGNOSIS — M25561 Pain in right knee: Secondary | ICD-10-CM | POA: Insufficient documentation

## 2022-06-04 MED ORDER — NAPROXEN 500 MG PO TABS: 500.0000 mg | ORAL_TABLET | Freq: Two times a day (BID) | ORAL | 0 refills | Status: DC

## 2022-06-04 NOTE — Patient Instructions (Addendum)
I appreciate the opportunity to provide care to you today!    Follow up: 2 weeks with PCP  A prescription of naproxen 500 mg to take twice daily sent to your pharmacy to take for pain relief   I recommend daily daily elevation of 20-30 minutes above level of heart, refraining from prolonged sitting or standing.   Avoid activities that aggravates or elicit pain at the affected sites  Apply heat to the affected area such as a moist heat pack or a heating pad. Place a towel between your skin and the heat source. Leave the heat on for 20-30 minutes. Remove the heat if your skin turns bright red. This is especially important if you are unable to feel pain, heat, or cold. You may have a greater risk of getting burned. Apply  ice on the painful area. To do this: If you have a removable splint, remove it as told by your health care provider. Put ice in a plastic bag. Place a towel between your skin and the bag or between your splint and the bag. Leave the ice on for 20 minutes, 2-3 times a day.       Please stop by [facility] anytime to get an x-ray of of the right knee     Please continue to a heart-healthy diet and increase your physical activities. Try to exercise for at least five days a week.      It was a pleasure to see you and I look forward to continuing to work together on your health and well-being. Please do not hesitate to call the office if you need care or have questions about your care.   Have a wonderful day and week. With Gratitude, Gilmore Laroche MSN, FNP-BC

## 2022-06-04 NOTE — Progress Notes (Signed)
Established Patient Office Visit  Subjective:  Patient ID: Colleen Rowe, female    DOB: Apr 25, 1960  Age: 62 y.o. MRN: [id number]  CC:  Chief Complaint  Patient presents with   Leg Pain    Pt reports knee pain on right knee ongoing for 2 weeks seems to be worsening. May need note for work.     HPI Colleen Rowe is a 62 y.o. female with past medical history of essential hypertension, OSA, type 2 diabetes and mixed hyperlipidemia presents for f/u of  chronic medical conditions. For the details of today's visit, please refer to the assessment and plan.     Past Medical History:  Diagnosis Date   Allergies    Diabetes mellitus without complication (HCC)    Hypertension     Past Surgical History:  Procedure Laterality Date   CHOLECYSTECTOMY     COLONOSCOPY WITH PROPOFOL N/A 10/21/2020   Procedure: COLONOSCOPY WITH PROPOFOL;  Surgeon: Lanelle Bal, DO;  Location: [facility];  Service: Endoscopy;  Laterality: N/A;  9:00 / ASA II   DENTAL SURGERY      Family History  Problem Relation Age of Onset   Breast cancer Mother    Diabetes Mother    Hypertension Mother    Breast cancer Sister    Colon cancer Neg Hx     Social History   Socioeconomic History   Marital status: Divorced    Spouse name: Not on file   Number of children: 2   Years of education: Not on file   Highest education level: Not on file  Occupational History   Not on file  Tobacco Use   Smoking status: Never   Smokeless tobacco: Never  Vaping Use   Vaping Use: Never used  Substance and Sexual Activity   Alcohol use: Not Currently   Drug use: Never   Sexual activity: Not Currently    Birth control/protection: Post-menopausal  Other Topics Concern   Not on file  Social History Narrative   Not on file   Social Determinants of Health   Financial Resource Strain: Unknown (10/13/2020)   Overall Financial Resource Strain (CARDIA)    Difficulty of Paying Living Expenses: Patient declined   Food Insecurity: Unknown (10/13/2020)   Hunger Vital Sign    Worried About Running Out of Food in the Last Year: Patient declined    Ran Out of Food in the Last Year: Patient declined  Transportation Needs: No Transportation Needs (10/13/2020)   PRAPARE - Administrator, Civil Service (Medical): No    Lack of Transportation (Non-Medical): No  Physical Activity: Sufficiently Active (10/13/2020)   Exercise Vital Sign    Days of Exercise per Week: 4 days    Minutes of Exercise per Session: 100 min  Stress: No Stress Concern Present (10/13/2020)   [employer] of Occupational Health - Occupational Stress Questionnaire    Feeling of Stress : Not at all  Social Connections: Unknown (10/13/2020)   Social Connection and Isolation Panel [NHANES]    Frequency of Communication with Friends and Family: Patient declined    Frequency of Social Gatherings with Friends and Family: Patient declined    Attends Religious Services: Patient declined    Database administrator or Organizations: No    Attends Banker Meetings: Never    Marital Status: Divorced  Catering manager Violence: Not At Risk (10/13/2020)   Humiliation, Afraid, Rape, and Kick questionnaire    Fear of Current  or Ex-Partner: No    Emotionally Abused: No    Physically Abused: No    Sexually Abused: No    Outpatient Medications Prior to Visit  Medication Sig Dispense Refill   acetaminophen (TYLENOL) 500 MG tablet Take 500-1,000 mg by mouth every 6 (six) hours as needed for moderate pain.     amLODipine (NORVASC) 10 MG tablet Take 1 tablet (10 mg total) by mouth daily. 90 tablet 1   blood glucose meter kit and supplies Dispense based on patient and insurance preference. Use up to four times daily as directed. (FOR ICD-10 E10.9, E11.9). 1 each 0   Calcium Carbonate Antacid (ALKA-SELTZER HEARTBURN PO) Take 2 tablets by mouth daily as needed (heartburn).     Cholecalciferol (VITAMIN D3) 50 MCG (2000 UT)  TABS Take 2,000 Units by mouth daily.     ferrous gluconate (FERGON) 240 (27 FE) MG tablet Take 480 mg by mouth daily.     fexofenadine (ALLEGRA) 180 MG tablet Take 180 mg by mouth daily as needed for allergies or rhinitis.     glipiZIDE (GLUCOTROL XL) 5 MG 24 hr tablet Take 1 tablet (5 mg total) by mouth daily with breakfast. 90 tablet 0   metFORMIN (GLUCOPHAGE) 500 MG tablet Take 1 tablet (500 mg total) by mouth 2 (two) times daily with a meal. 180 tablet 1   olmesartan-hydrochlorothiazide (BENICAR HCT) 40-25 MG tablet Take 1 tablet by mouth daily. 30 tablet 3   Simethicone (GAS-X PO) Take 1 tablet by mouth daily as needed (gas).     Tetrahydrozoline HCl (VISINE OP) Place 1 drop into both eyes daily as needed (redness).     vitamin B-12 (CYANOCOBALAMIN) 500 MCG tablet Take 500 mcg by mouth daily.     No facility-administered medications prior to visit.    Allergies  Allergen Reactions   Benadryl [Diphenhydramine]     SOB    ROS Review of Systems  Constitutional:  Negative for chills and fever.  Eyes:  Negative for visual disturbance.  Respiratory:  Negative for chest tightness and shortness of breath.   Musculoskeletal:        Right knee pain  Neurological:  Negative for dizziness and headaches.      Objective:    Physical Exam HENT:     Head: Normocephalic.     Mouth/Throat:     Mouth: Mucous membranes are moist.  Cardiovascular:     Rate and Rhythm: Normal rate.     Heart sounds: Normal heart sounds.  Pulmonary:     Effort: Pulmonary effort is normal.     Breath sounds: Normal breath sounds.  Musculoskeletal:     Right knee: Swelling (quadriceps) present. No deformity, erythema or ecchymosis. Decreased range of motion (pain with flexion).     Left knee: No swelling, deformity, effusion, erythema or ecchymosis. Normal range of motion. No tenderness.  Neurological:     Mental Status: She is alert.     BP (!) 172/88 (BP Location: Left Arm)   Pulse 71   Ht 5\' 8"   (1.727 m)   Wt 228 lb (103.4 kg)   SpO2 97%   BMI 34.67 kg/m  Wt Readings from Last 3 Encounters:  06/04/22 228 lb (103.4 kg)  05/10/22 229 lb 9.6 oz (104.1 kg)  03/08/22 235 lb 6.4 oz (106.8 kg)    Lab Results  Component Value Date   TSH 0.588 03/08/2022   Lab Results  Component Value Date   WBC 7.8 03/08/2022   HGB  13.1 03/08/2022   HCT 41.3 03/08/2022   MCV 82 03/08/2022   PLT 197 03/08/2022   Lab Results  Component Value Date   NA 140 03/08/2022   K 4.3 03/08/2022   CO2 19 (L) 03/08/2022   GLUCOSE 285 (H) 03/08/2022   BUN 8 03/08/2022   CREATININE 0.90 03/08/2022   BILITOT 0.3 03/08/2022   ALKPHOS 171 (H) 03/08/2022   AST 31 03/08/2022   ALT 32 03/08/2022   PROT 7.2 03/08/2022   ALBUMIN 3.9 03/08/2022   CALCIUM 9.3 03/08/2022   ANIONGAP 9 03/28/2014   EGFR 72 03/08/2022   Lab Results  Component Value Date   CHOL 133 03/08/2022   Lab Results  Component Value Date   HDL 39 (L) 03/08/2022   Lab Results  Component Value Date   LDLCALC 65 03/08/2022   Lab Results  Component Value Date   TRIG 172 (H) 03/08/2022   Lab Results  Component Value Date   CHOLHDL 3.4 03/08/2022   Lab Results  Component Value Date   HGBA1C 9.5 (H) 03/08/2022      Assessment & Plan:  Acute pain of right knee Assessment & Plan: No recent trauma or injury reported of the right knee Complains of pain and swelling above the right knee at the quadriceps Reports pain with abduction, flexion, and extension  Patient is unable to bear weight due to increased pain Symptoms likely of osteoarthritis given the patient's age, gender and  BMI Will treat today with rest, ice, elevation and naproxen 500 mg twice daily for 2 weeks Imaging studies ordered Will follow-up with PCP in 2 weeks  Orders: -     DG Knee 1-2 Views Right -     Naproxen; Take 1 tablet (500 mg total) by mouth 2 (two) times daily with a meal for 7 days.  Dispense: 14 tablet; Refill: 0    Follow-up: Return in  about 2 weeks (around 06/18/2022) for Right knee pain.   Gilmore Laroche, FNP

## 2022-06-04 NOTE — Assessment & Plan Note (Signed)
No recent trauma or injury reported of the right knee Complains of pain and swelling above the right knee at the quadriceps Reports pain with abduction, flexion, and extension  Patient is unable to bear weight due to increased pain Symptoms likely of osteoarthritis given the patient's age, gender and  BMI Will treat today with rest, ice, elevation and naproxen 500 mg twice daily for 2 weeks Imaging studies ordered Will follow-up with PCP in 2 weeks

## 2022-06-06 DIAGNOSIS — G4733 Obstructive sleep apnea (adult) (pediatric): Secondary | ICD-10-CM | POA: Diagnosis not present

## 2022-06-06 NOTE — Progress Notes (Signed)
Please advise the patient that the x-ray of her right knee revealed minimal arthritic changes. It's recommended that she maintain her treatment plan and attend her scheduled follow-up with her primary care physician.

## 2022-06-10 ENCOUNTER — Ambulatory Visit: Payer: 59 | Admitting: Internal Medicine

## 2022-06-10 ENCOUNTER — Encounter: Payer: Self-pay | Admitting: Internal Medicine

## 2022-06-10 VITALS — BP 148/84 | HR 71 | Ht 67.0 in | Wt 229.8 lb

## 2022-06-10 DIAGNOSIS — M1711 Unilateral primary osteoarthritis, right knee: Secondary | ICD-10-CM | POA: Diagnosis not present

## 2022-06-10 DIAGNOSIS — Z7984 Long term (current) use of oral hypoglycemic drugs: Secondary | ICD-10-CM | POA: Diagnosis not present

## 2022-06-10 DIAGNOSIS — E1169 Type 2 diabetes mellitus with other specified complication: Secondary | ICD-10-CM

## 2022-06-10 DIAGNOSIS — I10 Essential (primary) hypertension: Secondary | ICD-10-CM | POA: Diagnosis not present

## 2022-06-10 MED ORDER — MELOXICAM 7.5 MG PO TABS
7.5000 mg | ORAL_TABLET | Freq: Every day | ORAL | 2 refills | Status: DC
Start: 2022-06-10 — End: 2022-08-17

## 2022-06-10 NOTE — Assessment & Plan Note (Signed)
BP Readings from Last 1 Encounters:  06/10/22 (!) 148/84   Uncontrolled with Olmesartan-HCTZ  and Amlodipine, likely due to noncompliance Increased dose of olmesartan-HCTZ to 40-25 mg daily recently Counseled for compliance with the medications Advised DASH diet and moderate exercise/walking, at least 150 mins/week Needs to cut down soft drink intake

## 2022-06-10 NOTE — Assessment & Plan Note (Signed)
Lab Results  Component Value Date   HGBA1C 9.5 (H) 03/08/2022   Uncontrolled Associated with HTN, OSA and obesity on metformin, needs to be compliant - advised to ask pharmacy for refill as she has misplaced it Tried to start GLP-1 agonist, but Ozempic and Mounjaro were not covered Added glipizide 5 mg daily recently, blood glucose improving Advised to follow diabetic diet -needs to cut down soft drink intake Once she is compliant to current regimen, will start statin -currently lipid profile showed LDL less than 70, On ARB F/u CMP and lipid panel Diabetic eye exam: Advised to follow up with Ophthalmology for diabetic eye exam

## 2022-06-10 NOTE — Assessment & Plan Note (Signed)
Right knee pain could be due to OA of knee, but her pain is out of proportion to x-ray findings Tried naproxen without much relief Switched to meloxicam Apply ice Referred to orthopedic surgeon

## 2022-06-10 NOTE — Progress Notes (Signed)
Acute Office Visit  Subjective:    Patient ID: Colleen Rowe, female    DOB: 04-Jul-1960, 62 y.o.   MRN: [id number]  Chief Complaint  Patient presents with   Knee Pain    Right knee pain     HPI Patient is in today for complaint of persistent right knee pain, which started about 10 days ago.  She reports repetitive direct impact injury to right knee in the past.  Denies any recent injury or fall.  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  She had x-ray of right knee done in the last week, which showed  minimal degenerative changes.  She tried naproxen without much relief.  Denies any numbness or tingling of the LE.  Her blood pressure was elevated today.  Of note, she has not had her antihypertensives today.  Denies any headache, dizziness, chest pain, dyspnea or palpitations currently.  She has also misplaced her metformin, but reports that her blood glucose has been improving since starting glipizide.  She has not brought her blood glucose log today.  Past Medical History:  Diagnosis Date   Allergies    Diabetes mellitus without complication (HCC)    Hypertension     Past Surgical History:  Procedure Laterality Date   CHOLECYSTECTOMY     COLONOSCOPY WITH PROPOFOL N/A 10/21/2020   Procedure: COLONOSCOPY WITH PROPOFOL;  Surgeon: Lanelle Bal, DO;  Location: [facility];  Service: Endoscopy;  Laterality: N/A;  9:00 / ASA II   DENTAL SURGERY      Family History  Problem Relation Age of Onset   Breast cancer Mother    Diabetes Mother    Hypertension Mother    Breast cancer Sister    Colon cancer Neg Hx     Social History   Socioeconomic History   Marital status: Divorced    Spouse name: Not on file   Number of children: 2   Years of education: Not on file   Highest education level: Not on file  Occupational History   Not on file  Tobacco Use   Smoking  status: Never   Smokeless tobacco: Never  Vaping Use   Vaping Use: Never used  Substance and Sexual Activity   Alcohol use: Not Currently   Drug use: Never   Sexual activity: Not Currently    Birth control/protection: Post-menopausal  Other Topics Concern   Not on file  Social History Narrative   Not on file   Social Determinants of Health   Financial Resource Strain: Unknown (10/13/2020)   Overall Financial Resource Strain (CARDIA)    Difficulty of Paying Living Expenses: Patient declined  Food Insecurity: Unknown (10/13/2020)   Hunger Vital Sign    Worried About Running Out of Food in the Last Year: Patient declined    Ran Out of Food in the Last Year: Patient declined  Transportation Needs: No Transportation Needs (10/13/2020)   PRAPARE - Administrator, Civil Service (Medical): No    Lack of Transportation (Non-Medical): No  Physical Activity: Sufficiently Active (10/13/2020)   Exercise Vital Sign    Days of Exercise per Week: 4 days    Minutes of Exercise per Session: 100 min  Stress: No Stress Concern Present (10/13/2020)   [employer] of Occupational Health - Occupational Stress Questionnaire    Feeling of Stress : Not at all  Social Connections: Unknown (  10/13/2020)   Social Connection and Isolation Panel [NHANES]    Frequency of Communication with Friends and Family: Patient declined    Frequency of Social Gatherings with Friends and Family: Patient declined    Attends Religious Services: Patient declined    Database administrator or Organizations: No    Attends Banker Meetings: Never    Marital Status: Divorced  Catering manager Violence: Not At Risk (10/13/2020)   Humiliation, Afraid, Rape, and Kick questionnaire    Fear of Current or Ex-Partner: No    Emotionally Abused: No    Physically Abused: No    Sexually Abused: No    Outpatient Medications Prior to Visit  Medication Sig Dispense Refill   acetaminophen (TYLENOL) 500  MG tablet Take 500-1,000 mg by mouth every 6 (six) hours as needed for moderate pain.     amLODipine (NORVASC) 10 MG tablet Take 1 tablet (10 mg total) by mouth daily. 90 tablet 1   blood glucose meter kit and supplies Dispense based on patient and insurance preference. Use up to four times daily as directed. (FOR ICD-10 E10.9, E11.9). 1 each 0   Calcium Carbonate Antacid (ALKA-SELTZER HEARTBURN PO) Take 2 tablets by mouth daily as needed (heartburn).     Cholecalciferol (VITAMIN D3) 50 MCG (2000 UT) TABS Take 2,000 Units by mouth daily.     ferrous gluconate (FERGON) 240 (27 FE) MG tablet Take 480 mg by mouth daily.     fexofenadine (ALLEGRA) 180 MG tablet Take 180 mg by mouth daily as needed for allergies or rhinitis.     glipiZIDE (GLUCOTROL XL) 5 MG 24 hr tablet Take 1 tablet (5 mg total) by mouth daily with breakfast. 90 tablet 0   metFORMIN (GLUCOPHAGE) 500 MG tablet Take 1 tablet (500 mg total) by mouth 2 (two) times daily with a meal. 180 tablet 1   olmesartan-hydrochlorothiazide (BENICAR HCT) 40-25 MG tablet Take 1 tablet by mouth daily. 30 tablet 3   Simethicone (GAS-X PO) Take 1 tablet by mouth daily as needed (gas).     Tetrahydrozoline HCl (VISINE OP) Place 1 drop into both eyes daily as needed (redness).     vitamin B-12 (CYANOCOBALAMIN) 500 MCG tablet Take 500 mcg by mouth daily.     naproxen (NAPROSYN) 500 MG tablet Take 1 tablet (500 mg total) by mouth 2 (two) times daily with a meal for 7 days. 14 tablet 0   No facility-administered medications prior to visit.    Allergies  Allergen Reactions   Benadryl [Diphenhydramine]     SOB    Review of Systems  Constitutional:  Negative for chills and fever.  HENT:  Negative for congestion, sinus pressure, sinus pain and sore throat.   Eyes:  Negative for pain and discharge.  Respiratory:  Negative for cough and shortness of breath.   Cardiovascular:  Positive for leg swelling. Negative for chest pain and palpitations.   Gastrointestinal:  Negative for abdominal pain, constipation, diarrhea, nausea and vomiting.  Endocrine: Negative for polydipsia and polyuria.  Genitourinary:  Negative for dysuria and hematuria.  Musculoskeletal:  Positive for arthralgias (Right knee). Negative for neck pain and neck stiffness.  Skin:  Negative for rash.  Neurological:  Negative for dizziness and weakness.  Psychiatric/Behavioral:  Negative for agitation and behavioral problems.        Objective:    Physical Exam Vitals reviewed.  Constitutional:      General: She is not in acute distress.    Appearance: She  is not diaphoretic.  HENT:     Head: Normocephalic and atraumatic.     Nose: Nose normal.     Mouth/Throat:     Mouth: Mucous membranes are moist.  Eyes:     General: No scleral icterus.    Extraocular Movements: Extraocular movements intact.  Cardiovascular:     Rate and Rhythm: Normal rate and regular rhythm.     Pulses: Normal pulses.     Heart sounds: Normal heart sounds. No murmur heard. Pulmonary:     Breath sounds: Normal breath sounds. No wheezing or rales.  Musculoskeletal:     Cervical back: Neck supple. No tenderness.     Right knee: Swelling present. Tenderness present.     Right lower leg: No edema.     Left lower leg: No edema.  Feet:     Left foot:     Skin integrity: Dry skin present.  Skin:    General: Skin is warm.     Findings: No rash.  Neurological:     General: No focal deficit present.     Mental Status: She is alert and oriented to person, place, and time.     Sensory: No sensory deficit.     Motor: No weakness.  Psychiatric:        Mood and Affect: Mood normal.        Behavior: Behavior normal.     BP (!) 148/84 (BP Location: Right Arm)   Pulse 71   Ht 5\' 7"  (1.702 m)   Wt 229 lb 12.8 oz (104.2 kg)   SpO2 96%   BMI 35.99 kg/m  Wt Readings from Last 3 Encounters:  06/10/22 229 lb 12.8 oz (104.2 kg)  06/04/22 228 lb (103.4 kg)  05/10/22 229 lb 9.6 oz (104.1  kg)        Assessment & Plan:   Problem List Items Addressed This Visit       Cardiovascular and Mediastinum   Essential hypertension    BP Readings from Last 1 Encounters:  06/10/22 (!) 148/84  Uncontrolled with Olmesartan-HCTZ  and Amlodipine, likely due to noncompliance Increased dose of olmesartan-HCTZ to 40-25 mg daily recently Counseled for compliance with the medications Advised DASH diet and moderate exercise/walking, at least 150 mins/week Needs to cut down soft drink intake        Endocrine   Type 2 diabetes mellitus with other specified complication (HCC)    Lab Results  Component Value Date   HGBA1C 9.5 (H) 03/08/2022  Uncontrolled Associated with HTN, OSA and obesity on metformin, needs to be compliant - advised to ask pharmacy for refill as she has misplaced it Tried to start GLP-1 agonist, but Ozempic and Mounjaro were not covered Added glipizide 5 mg daily recently, blood glucose improving Advised to follow diabetic diet -needs to cut down soft drink intake Once she is compliant to current regimen, will start statin -currently lipid profile showed LDL less than 70, On ARB F/u CMP and lipid panel Diabetic eye exam: Advised to follow up with Ophthalmology for diabetic eye exam        Musculoskeletal and Integument   Primary osteoarthritis of right knee - Primary    Right knee pain could be due to OA of knee, but her pain is out of proportion to x-ray findings Tried naproxen without much relief Switched to meloxicam Apply ice Referred to orthopedic surgeon      Relevant Medications   meloxicam (MOBIC) 7.5 MG tablet   Other Relevant  Orders   Ambulatory referral to Orthopedic Surgery     Meds ordered this encounter  Medications   meloxicam (MOBIC) 7.5 MG tablet    Sig: Take 1 tablet (7.5 mg total) by mouth daily.    Dispense:  30 tablet    Refill:  2     Dimitry Holsworth Concha Se, MD

## 2022-06-10 NOTE — Patient Instructions (Signed)
Please start taking Meloxicam for knee pain instead of Naproxen.  Please take Amlodipine and Olmesartan-HCTZ for hypertension.  Please take Metformin and Glipizide for diabetes.  Please apply ice over the knee area.

## 2022-06-11 LAB — HEMOGLOBIN A1C
Est. average glucose Bld gHb Est-mCnc: 298 mg/dL
Hgb A1c MFr Bld: 12 % — ABNORMAL HIGH (ref 4.8–5.6)

## 2022-06-11 LAB — CMP14+EGFR
ALT: 34 IU/L — ABNORMAL HIGH (ref 0–32)
AST: 28 IU/L (ref 0–40)
Albumin/Globulin Ratio: 1.3 (ref 1.2–2.2)
Albumin: 4.3 g/dL (ref 3.9–4.9)
Alkaline Phosphatase: 148 IU/L — ABNORMAL HIGH (ref 44–121)
BUN/Creatinine Ratio: 23 (ref 12–28)
BUN: 20 mg/dL (ref 8–27)
Bilirubin Total: 0.3 mg/dL (ref 0.0–1.2)
CO2: 22 mmol/L (ref 20–29)
Calcium: 9.5 mg/dL (ref 8.7–10.3)
Chloride: 104 mmol/L (ref 96–106)
Creatinine, Ser: 0.87 mg/dL (ref 0.57–1.00)
Globulin, Total: 3.3 g/dL (ref 1.5–4.5)
Glucose: 187 mg/dL — ABNORMAL HIGH (ref 70–99)
Potassium: 4.1 mmol/L (ref 3.5–5.2)
Sodium: 140 mmol/L (ref 134–144)
Total Protein: 7.6 g/dL (ref 6.0–8.5)
eGFR: 75 mL/min/{1.73_m2} (ref >59)

## 2022-06-15 DIAGNOSIS — G4733 Obstructive sleep apnea (adult) (pediatric): Secondary | ICD-10-CM | POA: Diagnosis not present

## 2022-06-16 ENCOUNTER — Ambulatory Visit: Payer: 59 | Admitting: Orthopedic Surgery

## 2022-06-16 ENCOUNTER — Encounter: Payer: Self-pay | Admitting: Orthopedic Surgery

## 2022-06-16 VITALS — BP 148/88 | HR 78 | Ht 67.0 in | Wt 228.0 lb

## 2022-06-16 DIAGNOSIS — M25561 Pain in right knee: Secondary | ICD-10-CM | POA: Diagnosis not present

## 2022-06-16 NOTE — Progress Notes (Signed)
New Patient Visit  Assessment: Colleen Rowe is a 62 y.o. female with the following: Right knee pain  Plan: Rockey Situ has pain in the right knee.  No specific injury.  She describes it as an ache or throb currently.  She has been taking medications, using ice and topical treatments.  Currently, her knee feels better.  However, she has been out of work for the past 3 weeks, and is returning to work this evening.  Radiographs were obtained prior to today's visit, and these demonstrate some mild degenerative changes overall.  No acute injuries.  We discussed proceeding with an injection, and she elected to proceed.  This was completed in clinic today without issues.  She will follow-up as needed.  Procedure note injection Right knee joint   Verbal consent was obtained to inject the right knee joint  Timeout was completed to confirm the site of injection.  The skin was prepped with alcohol and ethyl chloride was sprayed at the injection site.  A 21-gauge needle was used to inject 40 mg of Depo-Medrol and 1% lidocaine (3 cc) into the right knee using an anterolateral approach.  There were no complications. A sterile bandage was applied.   Follow-up: Return if symptoms worsen or fail to improve.  Subjective:  Chief Complaint  Patient presents with   Knee Pain    R knee pain that started 3 wks has moved to an ache/throb now.    History of Present Illness: Colleen Rowe is a 62 y.o. female who has been referred by  Trena Platt, MD for evaluation of right knee pain.  She states she started to have pain in the right knee from about 3 weeks ago.  Initially, the pain was diffuse.  However, at this point, it is localized to the lateral aspect of the knee.  No specific injury.  She has tried NSAIDs, topical treatments and ice.  Currently, she states that is feeling better.  However, she is returning to work Quarry manager.  She has not been at work for the past 3 weeks.  She tried a brace, which was  not effective.  No prior injuries to her right knee.  She has not worked with physical therapy.  No prior injections.  She does not feel as though it is swollen currently, but she states it was previously swollen.   Review of Systems: No fevers or chills No numbness or tingling No chest pain No shortness of breath No bowel or bladder dysfunction No GI distress No headaches   Medical History:  Past Medical History:  Diagnosis Date   Allergies    Diabetes mellitus without complication (HCC)    Hypertension     Past Surgical History:  Procedure Laterality Date   CHOLECYSTECTOMY     COLONOSCOPY WITH PROPOFOL N/A 10/21/2020   Procedure: COLONOSCOPY WITH PROPOFOL;  Surgeon: Lanelle Bal, DO;  Location: [facility];  Service: Endoscopy;  Laterality: N/A;  9:00 / ASA II   DENTAL SURGERY      Family History  Problem Relation Age of Onset   Breast cancer Mother    Diabetes Mother    Hypertension Mother    Breast cancer Sister    Colon cancer Neg Hx    Social History   Tobacco Use   Smoking status: Never   Smokeless tobacco: Never  Vaping Use   Vaping Use: Never used  Substance Use Topics   Alcohol use: Not Currently   Drug use: Never  Allergies  Allergen Reactions   Benadryl [Diphenhydramine]     SOB    No outpatient medications have been marked as taking for the 06/16/22 encounter (Office Visit) with Oliver Barre, MD.    Objective: BP (!) 148/88   Pulse 78   Ht 5\' 7"  (1.702 m)   Wt 228 lb (103.4 kg)   BMI 35.71 kg/m   Physical Exam:  General: Alert and oriented. and No acute distress. Gait: Right sided antalgic gait.  Right knee without effusion.  She has good range of motion.  Tenderness palpation lateral.  No increased laxity varus valgus stress.  Negative Lachman.  Sensation intact distally.  IMAGING: I personally reviewed images previously obtained in clinic  X-rays of the right knee were obtained previously in clinic.  No acute  injuries.  Mild loss of joint space.  No obvious osteophytes.  No bony lesions.   New Medications:  No orders of the defined types were placed in this encounter.     Oliver Barre, MD  06/16/2022 9:20 AM

## 2022-06-16 NOTE — Patient Instructions (Signed)

## 2022-06-17 ENCOUNTER — Encounter (HOSPITAL_COMMUNITY): Payer: Self-pay

## 2022-06-17 ENCOUNTER — Ambulatory Visit (HOSPITAL_COMMUNITY): Payer: 59

## 2022-06-21 ENCOUNTER — Encounter (HOSPITAL_COMMUNITY): Payer: Self-pay

## 2022-06-21 ENCOUNTER — Ambulatory Visit (HOSPITAL_COMMUNITY)
Admission: RE | Admit: 2022-06-21 | Discharge: 2022-06-21 | Disposition: A | Discharge status: Home / Self Care | Payer: 59 | Source: Ambulatory Visit | Attending: Internal Medicine | Admitting: Internal Medicine

## 2022-06-21 ENCOUNTER — Ambulatory Visit: Payer: 59 | Admitting: Internal Medicine

## 2022-06-21 DIAGNOSIS — Z1231 Encounter for screening mammogram for malignant neoplasm of breast: Secondary | ICD-10-CM | POA: Insufficient documentation

## 2022-07-06 DIAGNOSIS — G4733 Obstructive sleep apnea (adult) (pediatric): Secondary | ICD-10-CM | POA: Diagnosis not present

## 2022-07-12 ENCOUNTER — Ambulatory Visit: Payer: 59 | Admitting: Internal Medicine

## 2022-07-12 ENCOUNTER — Encounter: Payer: Self-pay | Admitting: Internal Medicine

## 2022-07-12 ENCOUNTER — Ambulatory Visit (INDEPENDENT_AMBULATORY_CARE_PROVIDER_SITE_OTHER): Payer: 59 | Admitting: Internal Medicine

## 2022-07-12 VITALS — BP 129/77 | HR 83 | Ht 67.0 in | Wt 226.2 lb

## 2022-07-12 DIAGNOSIS — I10 Essential (primary) hypertension: Secondary | ICD-10-CM

## 2022-07-12 DIAGNOSIS — E1169 Type 2 diabetes mellitus with other specified complication: Secondary | ICD-10-CM | POA: Diagnosis not present

## 2022-07-12 MED ORDER — TRULICITY 0.75 MG/0.5ML ~~LOC~~ SOAJ
0.7500 mg | SUBCUTANEOUS | 2 refills | Status: DC
Start: 2022-07-12 — End: 2023-04-01

## 2022-07-12 MED ORDER — GLIPIZIDE 5 MG PO TABS
5.0000 mg | ORAL_TABLET | Freq: Two times a day (BID) | ORAL | 3 refills | Status: DC
Start: 2022-07-12 — End: 2022-08-17

## 2022-07-12 NOTE — Progress Notes (Signed)
Established Patient Office Visit  Subjective:  Patient ID: Colleen Rowe, female    DOB: 21-Aug-1960  Age: 62 y.o. MRN: [id number]  CC:  Chief Complaint  Patient presents with   Diabetes   Hypertension    HPI Colleen Rowe is a 62 y.o. female with past medical history of HTN, type II DM and morbid obesity who presents for f/u of her chronic medical conditions.  HTN: BP is wnl today. She reports taking medicines regularly,. Patient denies headache, dizziness, chest pain, dyspnea or palpitations.  Type II DM: Her HbA1c was elevated at 12.0 in 06/24.  She has been taking metformin BID, but compliance is questionable.  She was placed on Mounjaro and later Ozempic, but she was not able to get it due to insurance coverage concern.  She was placed on glipizide 5 mg QD recently.  She reports that her blood glucose stays around 150-200 (?)  since the last blood test as she has started taking metformin and glipizide regularly.  She also reports cutting down on soft drink intake. She had been taking at least 3 soft drinks in a day. She denies any polyuria or polydipsia currently. She also reports that she needs to improve her diet.  She has been using CPAP device regularly now.    Past Medical History:  Diagnosis Date   Allergies    Diabetes mellitus without complication (HCC)    Hypertension     Past Surgical History:  Procedure Laterality Date   CHOLECYSTECTOMY     COLONOSCOPY WITH PROPOFOL N/A 10/21/2020   Procedure: COLONOSCOPY WITH PROPOFOL;  Surgeon: Lanelle Bal, DO;  Location: [facility];  Service: Endoscopy;  Laterality: N/A;  9:00 / ASA II   DENTAL SURGERY      Family History  Problem Relation Age of Onset   Breast cancer Mother    Diabetes Mother    Hypertension Mother    Breast cancer Sister    Colon cancer Neg Hx     Social History   Socioeconomic History   Marital status: Divorced    Spouse name: Not on file   Number of children: 2   Years of  education: Not on file   Highest education level: Not on file  Occupational History   Not on file  Tobacco Use   Smoking status: Never   Smokeless tobacco: Never  Vaping Use   Vaping Use: Never used  Substance and Sexual Activity   Alcohol use: Not Currently   Drug use: Never   Sexual activity: Not Currently    Birth control/protection: Post-menopausal  Other Topics Concern   Not on file  Social History Narrative   Not on file   Social Determinants of Health   Financial Resource Strain: Unknown (10/13/2020)   Overall Financial Resource Strain (CARDIA)    Difficulty of Paying Living Expenses: Patient declined  Food Insecurity: Unknown (10/13/2020)   Hunger Vital Sign    Worried About Running Out of Food in the Last Year: Patient declined    Ran Out of Food in the Last Year: Patient declined  Transportation Needs: No Transportation Needs (10/13/2020)   PRAPARE - Administrator, Civil Service (Medical): No    Lack of Transportation (Non-Medical): No  Physical Activity: Sufficiently Active (10/13/2020)   Exercise Vital Sign    Days of Exercise per Week: 4 days    Minutes of Exercise per Session: 100 min  Stress: No Stress Concern Present (10/13/2020)  [employer] of Occupational Health - Occupational Stress Questionnaire    Feeling of Stress : Not at all  Social Connections: Unknown (10/13/2020)   Social Connection and Isolation Panel [NHANES]    Frequency of Communication with Friends and Family: Patient declined    Frequency of Social Gatherings with Friends and Family: Patient declined    Attends Religious Services: Patient declined    Database administrator or Organizations: No    Attends Banker Meetings: Never    Marital Status: Divorced  Catering manager Violence: Not At Risk (10/13/2020)   Humiliation, Afraid, Rape, and Kick questionnaire    Fear of Current or Ex-Partner: No    Emotionally Abused: No    Physically Abused: No     Sexually Abused: No    Outpatient Medications Prior to Visit  Medication Sig Dispense Refill   acetaminophen (TYLENOL) 500 MG tablet Take 500-1,000 mg by mouth every 6 (six) hours as needed for moderate pain.     amLODipine (NORVASC) 10 MG tablet Take 1 tablet (10 mg total) by mouth daily. 90 tablet 1   blood glucose meter kit and supplies Dispense based on patient and insurance preference. Use up to four times daily as directed. (FOR ICD-10 E10.9, E11.9). 1 each 0   Calcium Carbonate Antacid (ALKA-SELTZER HEARTBURN PO) Take 2 tablets by mouth daily as needed (heartburn).     Cholecalciferol (VITAMIN D3) 50 MCG (2000 UT) TABS Take 2,000 Units by mouth daily.     ferrous gluconate (FERGON) 240 (27 FE) MG tablet Take 480 mg by mouth daily.     fexofenadine (ALLEGRA) 180 MG tablet Take 180 mg by mouth daily as needed for allergies or rhinitis.     meloxicam (MOBIC) 7.5 MG tablet Take 1 tablet (7.5 mg total) by mouth daily. 30 tablet 2   metFORMIN (GLUCOPHAGE) 500 MG tablet Take 1 tablet (500 mg total) by mouth 2 (two) times daily with a meal. 180 tablet 1   olmesartan-hydrochlorothiazide (BENICAR HCT) 40-25 MG tablet Take 1 tablet by mouth daily. 30 tablet 3   Simethicone (GAS-X PO) Take 1 tablet by mouth daily as needed (gas).     Tetrahydrozoline HCl (VISINE OP) Place 1 drop into both eyes daily as needed (redness).     vitamin B-12 (CYANOCOBALAMIN) 500 MCG tablet Take 500 mcg by mouth daily.     glipiZIDE (GLUCOTROL XL) 5 MG 24 hr tablet Take 1 tablet (5 mg total) by mouth daily with breakfast. 90 tablet 0   No facility-administered medications prior to visit.    Allergies  Allergen Reactions   Benadryl [Diphenhydramine]     SOB    ROS Review of Systems  Constitutional:  Negative for chills and fever.  HENT:  Negative for congestion, sinus pressure, sinus pain and sore throat.   Eyes:  Negative for pain and discharge.  Respiratory:  Negative for cough and shortness of breath.    Cardiovascular:  Positive for leg swelling. Negative for chest pain and palpitations.  Gastrointestinal:  Negative for abdominal pain, constipation, diarrhea, nausea and vomiting.  Endocrine: Negative for polydipsia and polyuria.  Genitourinary:  Negative for dysuria and hematuria.  Musculoskeletal:  Negative for neck pain and neck stiffness.  Skin:  Negative for rash.  Neurological:  Negative for dizziness and weakness.  Psychiatric/Behavioral:  Negative for agitation and behavioral problems.       Objective:    Physical Exam Vitals reviewed.  Constitutional:      General: She is  not in acute distress.    Appearance: She is not diaphoretic.  HENT:     Head: Normocephalic and atraumatic.     Nose: Nose normal.     Mouth/Throat:     Mouth: Mucous membranes are moist.  Eyes:     General: No scleral icterus.    Extraocular Movements: Extraocular movements intact.  Cardiovascular:     Rate and Rhythm: Normal rate and regular rhythm.     Pulses: Normal pulses.     Heart sounds: Normal heart sounds. No murmur heard. Pulmonary:     Breath sounds: Normal breath sounds. No wheezing or rales.  Musculoskeletal:     Cervical back: Neck supple. No tenderness.     Right lower leg: No edema.     Left lower leg: No edema.  Feet:     Left foot:     Skin integrity: Dry skin present.  Skin:    General: Skin is warm.     Findings: No rash.  Neurological:     General: No focal deficit present.     Mental Status: She is alert and oriented to person, place, and time.     Sensory: No sensory deficit.     Motor: No weakness.  Psychiatric:        Mood and Affect: Mood normal.        Behavior: Behavior normal.     BP 129/77 (BP Location: Right Arm, Patient Position: Sitting, Cuff Size: Large)   Pulse 83   Ht 5\' 7"  (1.702 m)   Wt 226 lb 3.2 oz (102.6 kg)   SpO2 97%   BMI 35.43 kg/m  Wt Readings from Last 3 Encounters:  07/12/22 226 lb 3.2 oz (102.6 kg)  06/16/22 228 lb (103.4 kg)   06/10/22 229 lb 12.8 oz (104.2 kg)    Lab Results  Component Value Date   TSH 0.588 03/08/2022   Lab Results  Component Value Date   WBC 7.8 03/08/2022   HGB 13.1 03/08/2022   HCT 41.3 03/08/2022   MCV 82 03/08/2022   PLT 197 03/08/2022   Lab Results  Component Value Date   NA 140 06/10/2022   K 4.1 06/10/2022   CO2 22 06/10/2022   GLUCOSE 187 (H) 06/10/2022   BUN 20 06/10/2022   CREATININE 0.87 06/10/2022   BILITOT 0.3 06/10/2022   ALKPHOS 148 (H) 06/10/2022   AST 28 06/10/2022   ALT 34 (H) 06/10/2022   PROT 7.6 06/10/2022   ALBUMIN 4.3 06/10/2022   CALCIUM 9.5 06/10/2022   ANIONGAP 9 03/28/2014   EGFR 75 06/10/2022   Lab Results  Component Value Date   CHOL 133 03/08/2022   Lab Results  Component Value Date   HDL 39 (L) 03/08/2022   Lab Results  Component Value Date   LDLCALC 65 03/08/2022   Lab Results  Component Value Date   TRIG 172 (H) 03/08/2022   Lab Results  Component Value Date   CHOLHDL 3.4 03/08/2022   Lab Results  Component Value Date   HGBA1C 12.0 (H) 06/10/2022      Assessment & Plan:   Problem List Items Addressed This Visit       Cardiovascular and Mediastinum   Essential hypertension    BP Readings from Last 1 Encounters:  07/12/22 129/77  Well-controlled with Olmesartan-hydrochlorothiazide 40-25 mg QD and Amlodipine 10 mg QD Counseled for compliance with the medications Advised DASH diet and moderate exercise/walking, at least 150 mins/week Needs to cut down soft  drink intake        Endocrine   Type 2 diabetes mellitus with other specified complication (HCC) - Primary    Lab Results  Component Value Date   HGBA1C 12.0 (H) 06/10/2022  Uncontrolled, compliance is questionable Associated with HTN, OSA and obesity on metformin 500 mg BID and recently added glipizide 5 mg QD, needs to be compliant Increased dose of Glipizide to 5 mg BID Tried to start GLP-1 agonist, but Ozempic and Mounjaro were not covered - added  Trulicity, coupon provided Advised to follow diabetic diet -needs to cut down soft drink intake Once she is compliant to current regimen, will start statin -currently lipid profile showed LDL less than 70, On ARB F/u CMP and lipid panel Diabetic eye exam: Advised to follow up with Ophthalmology for diabetic eye exam      Relevant Medications   glipiZIDE (GLUCOTROL) 5 MG tablet   Dulaglutide (TRULICITY) 0.75 MG/0.5ML SOPN   Other Relevant Orders   Urine Microalbumin w/creat. ratio     Other   Morbid obesity (HCC)    BMI Readings from Last 3 Encounters:  07/12/22 35.43 kg/m  06/16/22 35.71 kg/m  06/10/22 35.99 kg/m  Associated with HTN and DM Advised to follow low carb diet and perform moderate exercise/walking at least 150 mins/week She is a good candidate for GLP-1 agonist therapy, but Ozempic and Mounjaro was not covered - started Trulicity      Relevant Medications   glipiZIDE (GLUCOTROL) 5 MG tablet   Dulaglutide (TRULICITY) 0.75 MG/0.5ML SOPN   Meds ordered this encounter  Medications   glipiZIDE (GLUCOTROL) 5 MG tablet    Sig: Take 1 tablet (5 mg total) by mouth 2 (two) times daily before a meal.    Dispense:  60 tablet    Refill:  3   Dulaglutide (TRULICITY) 0.75 MG/0.5ML SOPN    Sig: Inject 0.75 mg into the skin once a week.    Dispense:  2 mL    Refill:  2    Follow-up: Return in about 2 months (around 09/12/2022) for DM and HTN.    Anabel Halon, MD

## 2022-07-12 NOTE — Assessment & Plan Note (Signed)
BP Readings from Last 1 Encounters:  07/12/22 129/77   Well-controlled with Olmesartan-hydrochlorothiazide 40-25 mg QD and Amlodipine 10 mg QD Counseled for compliance with the medications Advised DASH diet and moderate exercise/walking, at least 150 mins/week Needs to cut down soft drink intake

## 2022-07-12 NOTE — Assessment & Plan Note (Signed)
BMI Readings from Last 3 Encounters:  07/12/22 35.43 kg/m  06/16/22 35.71 kg/m  06/10/22 35.99 kg/m   Associated with HTN and DM Advised to follow low carb diet and perform moderate exercise/walking at least 150 mins/week She is a good candidate for GLP-1 agonist therapy, but Ozempic and Mounjaro was not covered - started [employer]

## 2022-07-12 NOTE — Patient Instructions (Addendum)
Please start taking Glipizide twice daily instead of once daily.  Please continue taking Metformin 500 mg twice daily.  Please start taking Trulicity 0.75 mg once weekly as prescribed.  Please start taking Olmesartan-hydrochlorothiazide and Amlodipine for blood pressure.  Please bring blood glucose log in the next visit.

## 2022-07-12 NOTE — Assessment & Plan Note (Signed)
Lab Results  Component Value Date   HGBA1C 12.0 (H) 06/10/2022   Uncontrolled, compliance is questionable Associated with HTN, OSA and obesity on metformin 500 mg BID and recently added glipizide 5 mg QD, needs to be compliant Increased dose of Glipizide to 5 mg BID Tried to start GLP-1 agonist, but Ozempic and Mounjaro were not covered - added Trulicity, coupon provided Advised to follow diabetic diet -needs to cut down soft drink intake Once she is compliant to current regimen, will start statin -currently lipid profile showed LDL less than 70, On ARB F/u CMP and lipid panel Diabetic eye exam: Advised to follow up with Ophthalmology for diabetic eye exam

## 2022-07-15 DIAGNOSIS — G4733 Obstructive sleep apnea (adult) (pediatric): Secondary | ICD-10-CM | POA: Diagnosis not present

## 2022-07-26 ENCOUNTER — Other Ambulatory Visit: Payer: Self-pay | Admitting: Internal Medicine

## 2022-07-27 ENCOUNTER — Other Ambulatory Visit (HOSPITAL_COMMUNITY): Payer: Self-pay

## 2022-07-27 ENCOUNTER — Other Ambulatory Visit: Payer: Self-pay

## 2022-07-27 ENCOUNTER — Telehealth: Payer: Self-pay | Admitting: Internal Medicine

## 2022-07-27 MED ORDER — BLOOD GLUCOSE METER KIT
PACK | 0 refills | Status: DC
  Filled 2022-07-27: qty 1, fill #0

## 2022-07-27 MED ORDER — BLOOD GLUCOSE METER KIT: PACK | 0 refills | Status: DC

## 2022-07-27 NOTE — Telephone Encounter (Signed)
[facility]

## 2022-07-27 NOTE — Telephone Encounter (Signed)
Pt needs blood glucose meter kit and supplies  [facility] [address] [facility]

## 2022-08-06 DIAGNOSIS — G4733 Obstructive sleep apnea (adult) (pediatric): Secondary | ICD-10-CM | POA: Diagnosis not present

## 2022-08-15 DIAGNOSIS — G4733 Obstructive sleep apnea (adult) (pediatric): Secondary | ICD-10-CM | POA: Diagnosis not present

## 2022-08-17 ENCOUNTER — Other Ambulatory Visit: Payer: Self-pay

## 2022-08-17 ENCOUNTER — Other Ambulatory Visit: Payer: Self-pay | Admitting: Internal Medicine

## 2022-08-17 ENCOUNTER — Telehealth: Payer: Self-pay | Admitting: Internal Medicine

## 2022-08-17 DIAGNOSIS — E119 Type 2 diabetes mellitus without complications: Secondary | ICD-10-CM

## 2022-08-17 DIAGNOSIS — I10 Essential (primary) hypertension: Secondary | ICD-10-CM

## 2022-08-17 DIAGNOSIS — E1169 Type 2 diabetes mellitus with other specified complication: Secondary | ICD-10-CM

## 2022-08-17 DIAGNOSIS — M1711 Unilateral primary osteoarthritis, right knee: Secondary | ICD-10-CM

## 2022-08-17 MED ORDER — GLIPIZIDE 5 MG PO TABS
5.0000 mg | ORAL_TABLET | Freq: Two times a day (BID) | ORAL | 5 refills | Status: DC
Start: 2022-08-17 — End: 2023-07-27

## 2022-08-17 MED ORDER — OLMESARTAN MEDOXOMIL-HCTZ 40-25 MG PO TABS
1.0000 | ORAL_TABLET | Freq: Every day | ORAL | 1 refills | Status: DC
Start: 2022-08-17 — End: 2023-07-26

## 2022-08-17 MED ORDER — AMLODIPINE BESYLATE 10 MG PO TABS: 10.0000 mg | ORAL_TABLET | Freq: Every day | ORAL | 1 refills | Status: DC

## 2022-08-17 MED ORDER — MELOXICAM 7.5 MG PO TABS: 7.5000 mg | ORAL_TABLET | Freq: Every day | ORAL | 2 refills | Status: DC

## 2022-08-17 MED ORDER — METFORMIN HCL 500 MG PO TABS
500.0000 mg | ORAL_TABLET | Freq: Two times a day (BID) | ORAL | 1 refills | Status: DC
Start: 2022-08-17 — End: 2023-07-27

## 2022-08-17 MED ORDER — BLOOD GLUCOSE METER KIT: PACK | 0 refills | Status: DC

## 2022-08-17 NOTE — Telephone Encounter (Signed)
Patient advised all medications sent to [facility]

## 2022-08-17 NOTE — Telephone Encounter (Signed)
Prescription Request  08/17/2022  LOV: 07/12/2022  What is the name of the medication or equipment? blood glucose meter kit and supplies [[id number]]  metFORMIN (GLUCOPHAGE) 500 MG tablet [[id number]]  glipiZIDE (GLUCOTROL) 5 MG tablet [[id number]]  meloxicam (MOBIC) 7.5 MG tablet [[id number]]  olmesartan-hydrochlorothiazide (BENICAR HCT) 40-25 MG tablet [[id number]]  amLODipine (NORVASC) 10 MG tablet [[id number]]     Have you contacted your pharmacy to request a refill? No   Which pharmacy would you like this sent to?   [id number] [place] [place] - [address] AT [facility] [address] [place] [address] Phone: [phone] Fax: [phone]     Patient notified that their request is being sent to the clinical staff for review and that they should receive a response within 2 business days.   Please advise at [facility] [phone]      Pt wants a cll back wants to know if she need to be taking both BP medications.

## 2022-08-18 ENCOUNTER — Other Ambulatory Visit: Payer: Self-pay | Admitting: Internal Medicine

## 2022-08-18 NOTE — Telephone Encounter (Signed)
Patient called asking why [facility] denied her glucose meter and supplies. Patient asking for a call back [phone].

## 2022-08-18 NOTE — Telephone Encounter (Signed)
Spoke to Patient, she needs to contact her pharmacy to see why it was denied

## 2022-08-20 ENCOUNTER — Telehealth: Payer: Self-pay | Admitting: Internal Medicine

## 2022-08-20 NOTE — Telephone Encounter (Signed)
Refills sent to pharmacy on 8/13-patient advised to speak to pharmacy

## 2022-08-20 NOTE — Telephone Encounter (Signed)
Prescription Request  08/20/2022  LOV: 07/12/2022  What is the name of the medication or equipment? amLODipine (NORVASC) 10 MG tablet [[id number]]   olmesartan-hydrochlorothiazide (BENICAR HCT) 40-25 MG tablet [[id number]]    Have you contacted your pharmacy to request a refill? Yes   Which pharmacy would you like this sent to?  [facility]   Patient notified that their request is being sent to the clinical staff for review and that they should receive a response within 2 business days.   Please advise at Mobile [phone] (mobile)

## 2022-09-14 ENCOUNTER — Telehealth: Payer: Self-pay | Admitting: Internal Medicine

## 2022-09-14 ENCOUNTER — Other Ambulatory Visit: Payer: Self-pay

## 2022-09-14 NOTE — Telephone Encounter (Signed)
Prescription Request  09/14/2022  LOV: 07/12/2022  What is the name of the medication or equipment? Test strips for glucose  Have you contacted your pharmacy to request a refill? No   Which pharmacy would you like this sent to?  [facility]   Patient notified that their request is being sent to the clinical staff for review and that they should receive a response within 2 business days.   Please advise at Mobile [phone] (mobile)

## 2022-09-14 NOTE — Telephone Encounter (Signed)
Left message for patient, need to know the brand of test strips and how often she checks

## 2022-09-14 NOTE — Telephone Encounter (Signed)
error 

## 2022-09-15 ENCOUNTER — Other Ambulatory Visit: Payer: Self-pay

## 2022-09-15 MED ORDER — GLUCOSE BLOOD VI STRP: ORAL_STRIP | 12 refills | Status: DC

## 2022-09-15 NOTE — Telephone Encounter (Signed)
Machine : Accu chek  Check her sugar 2 to 3 times a day

## 2022-09-15 NOTE — Telephone Encounter (Signed)
Pt left voicemail  returned call in regard to previous tele message

## 2022-09-15 NOTE — Telephone Encounter (Signed)
Refill sent to pharmacy.   

## 2022-10-12 ENCOUNTER — Ambulatory Visit: Payer: 59 | Admitting: Internal Medicine

## 2022-11-01 ENCOUNTER — Ambulatory Visit: Payer: 59 | Admitting: Internal Medicine

## 2022-11-01 ENCOUNTER — Telehealth: Payer: Self-pay | Admitting: Internal Medicine

## 2022-11-01 NOTE — Telephone Encounter (Signed)
Patient called in regard to medications. Patient is in between insurance coverage and is out of medications. Wants to see if she is on any medication that she can get sample for or any assistance she can get to help with med cost until she can get enrolled with new insurance.  Wants a call back.

## 2022-11-02 ENCOUNTER — Telehealth: Payer: Self-pay | Admitting: Internal Medicine

## 2022-11-02 NOTE — Telephone Encounter (Signed)
Left message for patient

## 2022-11-02 NOTE — Telephone Encounter (Signed)
Prescription Request  11/02/2022  LOV: 07/12/2022  What is the name of the medication or equipment? ALL ACTIVE MEDICATIONS  Have you contacted your pharmacy to request a refill? No   Which pharmacy would you like this sent to?    [id number] [place], [place] - [address] AT [facility] [address] [place] [address] Phone: [phone] Fax: [phone]      Patient notified that their request is being sent to the clinical staff for review and that they should receive a response within 2 business days.   Please advise at Mobile [phone] (mobile)

## 2022-11-02 NOTE — Telephone Encounter (Signed)
Patient needs to call pharmacy to have medication refilled, refills on file

## 2022-12-01 ENCOUNTER — Ambulatory Visit: Payer: 59 | Admitting: Internal Medicine

## 2023-03-02 ENCOUNTER — Telehealth: Payer: Self-pay

## 2023-03-02 NOTE — Telephone Encounter (Signed)
 Appt scheduled, patient aware.

## 2023-03-02 NOTE — Telephone Encounter (Signed)
 Patient was identified as falling into the [place] Measure - Diabetes.   Patient was: Left voicemail to schedule with primary care provider.   Please contact patient - requires A1C follow up with PCP with labs.

## 2023-03-16 ENCOUNTER — Ambulatory Visit: Payer: 59 | Admitting: Internal Medicine

## 2023-04-01 ENCOUNTER — Ambulatory Visit: Payer: Self-pay

## 2023-04-01 ENCOUNTER — Encounter: Payer: Self-pay | Admitting: Nurse Practitioner

## 2023-04-01 ENCOUNTER — Ambulatory Visit: Payer: Self-pay | Admitting: Nurse Practitioner

## 2023-04-01 VITALS — BP 142/86 | HR 68 | Temp 97.7°F | Ht 67.0 in | Wt 227.0 lb

## 2023-04-01 DIAGNOSIS — J300 Vasomotor rhinitis: Secondary | ICD-10-CM

## 2023-04-01 DIAGNOSIS — H73893 Other specified disorders of tympanic membrane, bilateral: Secondary | ICD-10-CM

## 2023-04-01 NOTE — Telephone Encounter (Signed)
  Chief Complaint: right earache x 4 weeks Symptoms: no pain at time of call but varies  Frequency: 4 weeks  Pertinent Negatives: Patient denies fever, sore throat Disposition: [] ED /[] Urgent Care (no appt availability in office) / [x] Appointment(In office/virtual)/ []  [facility]/ [] Home Care/ [] Refused Recommended Disposition /[] [facility]/ []  Follow-up with PCP Additional Notes: pt called and was sitting in parking lot of clinic. No openings today. Called [facility] to confirm. Made appt for pt to be seen today near her home.  Copied from CRM [id number]. Topic: Clinical - Red Word Triage >> Apr 01, 2023  8:47 AM Gildardo Pounds wrote: Red Word that prompted transfer to Nurse Triage: Patient has issues with right ear for the past 4 weeks. Getting worse. She thought it was a sore inside. Callback number is [phone] Reason for Disposition  Diabetes mellitus or weak immune system (e.g., HIV positive, cancer chemo, splenectomy, organ transplant, chronic steroids)  Answer Assessment - Initial Assessment Questions 1. LOCATION: "Which ear is involved?"     right 2. ONSET: "When did the ear start hurting"      4 weeks  3. SEVERITY: "How bad is the pain?"  (Scale 1-10; mild, moderate or severe)   - MILD (1-3): doesn't interfere with normal activities    - MODERATE (4-7): interferes with normal activities or awakens from sleep    - SEVERE (8-10): excruciating pain, unable to do any normal activities      No pain at present  4. URI SYMPTOMS: "Do you have a runny nose or cough?"     *No Answer* 5. FEVER: "Do you have a fever?" If Yes, ask: "What is your temperature, how was it measured, and when did it start?"     no  7. OTHER SYMPTOMS: "Do you have any other symptoms?" (e.g., headache, stiff neck, dizziness, vomiting, runny nose, decreased hearing)     no  Protocols used: Earache-A-AH

## 2023-04-01 NOTE — Patient Instructions (Signed)
 Flonase Nasacort AQ

## 2023-04-02 ENCOUNTER — Encounter: Payer: Self-pay | Admitting: Nurse Practitioner

## 2023-04-02 NOTE — Progress Notes (Signed)
   Subjective:    Patient ID: Colleen Rowe, female    DOB: [birth year], 63 y.o.   MRN: [id number]  HPI Presents with complaints of right ear pain occurring off and on for the past 4 weeks.  Describes a "sore area" near the opening of the ear.  Has tried putting Campho-Phenique in her ear canal with minimal improvement.  No fever.  No difficulty hearing.  No drainage.   Review of Systems  Constitutional:  Negative for fever.  HENT:  Positive for congestion and ear pain. Negative for hearing loss, sinus pressure, sinus pain and sore throat.   Respiratory:  Negative for cough, chest tightness, shortness of breath and wheezing.   Cardiovascular:  Negative for chest pain.       Objective:   Physical Exam Vitals and nursing note reviewed.  Constitutional:      General: She is not in acute distress. HENT:     Right Ear: Ear canal and external ear normal. There is no impacted cerumen.     Left Ear: Ear canal and external ear normal. There is no impacted cerumen.     Ears:     Comments: Both TMs are significantly retracted, no erythema.    Nose:     Comments: Nasal mucosa pale and very boggy.    Mouth/Throat:     Mouth: Mucous membranes are moist.     Pharynx: Oropharynx is clear.  Cardiovascular:     Rate and Rhythm: Normal rate and regular rhythm.     Heart sounds: Normal heart sounds.  Pulmonary:     Effort: Pulmonary effort is normal.     Breath sounds: Normal breath sounds.  Lymphadenopathy:     Cervical: No cervical adenopathy.  Neurological:     Mental Status: She is alert.    Today's Vitals   04/01/23 1013 04/01/23 1108  BP: (!) 142/82 (!) 142/86  Pulse: 68   Temp: 97.7 F (36.5 C)   SpO2: 97%   Weight: 227 lb (103 kg)   Height: 5\' 7"  (1.702 m)    Body mass index is 35.55 kg/m.         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  Also recommend  steroid nasal spray such as Flonase or Nasacort a daily.  Reviewed proper way to use nasal spray. Warning signs reviewed.  Recheck with Dr. Allena Katz if symptoms worsen or persist.

## 2023-05-13 ENCOUNTER — Ambulatory Visit: Admitting: Internal Medicine

## 2023-06-07 ENCOUNTER — Other Ambulatory Visit (HOSPITAL_COMMUNITY): Payer: Self-pay | Admitting: Internal Medicine

## 2023-06-07 DIAGNOSIS — Z1231 Encounter for screening mammogram for malignant neoplasm of breast: Secondary | ICD-10-CM

## 2023-06-15 ENCOUNTER — Ambulatory Visit (HOSPITAL_COMMUNITY)
Admission: RE | Admit: 2023-06-15 | Discharge: 2023-06-15 | Disposition: A | Discharge status: Home / Self Care | Payer: Self-pay | Source: Ambulatory Visit | Attending: Internal Medicine | Admitting: Internal Medicine

## 2023-06-15 DIAGNOSIS — Z1231 Encounter for screening mammogram for malignant neoplasm of breast: Secondary | ICD-10-CM | POA: Diagnosis present

## 2023-06-20 ENCOUNTER — Other Ambulatory Visit (HOSPITAL_COMMUNITY)
Admission: RE | Admit: 2023-06-20 | Discharge: 2023-06-20 | Disposition: A | Discharge status: Home / Self Care | Source: Ambulatory Visit | Attending: Obstetrics & Gynecology | Admitting: Obstetrics & Gynecology

## 2023-06-20 ENCOUNTER — Ambulatory Visit: Payer: Self-pay | Admitting: Obstetrics & Gynecology

## 2023-06-20 ENCOUNTER — Encounter: Payer: Self-pay | Admitting: Obstetrics & Gynecology

## 2023-06-20 VITALS — BP 175/91 | HR 80 | Ht 68.0 in | Wt 220.6 lb

## 2023-06-20 DIAGNOSIS — R030 Elevated blood-pressure reading, without diagnosis of hypertension: Secondary | ICD-10-CM

## 2023-06-20 DIAGNOSIS — Z01419 Encounter for gynecological examination (general) (routine) without abnormal findings: Secondary | ICD-10-CM | POA: Insufficient documentation

## 2023-06-20 NOTE — Progress Notes (Signed)
 WELL-WOMAN EXAMINATION Patient name: Colleen Rowe MRN [id number]  Date of birth: 24-Oct-1960 Chief Complaint:   Annual Exam  History of Present Illness:   Colleen Rowe is a 63 y.o. [phone] female being seen today for a routine well-woman exam.   Denies vaginal bleeding, discharge, itching or irritation.  Denies pelvic or abdominal pain.  No urinary concerns.  Reports no acute GYN issues  No LMP recorded. Patient is postmenopausal.  The current method of family planning is post menopausal status.    Last pap 2022.  Last mammogram: 06/2023. Last colonoscopy: 2022     04/01/2023   10:20 AM 07/12/2022    9:11 AM 06/10/2022    8:15 AM 06/04/2022   10:18 AM 05/10/2022   10:54 AM  Depression screen PHQ 2/9  Decreased Interest 0 0 0 0 0  Down, Depressed, Hopeless 0 0 0 0 0  PHQ - 2 Score 0 0 0 0 0  Altered sleeping 0 0  0   Tired, decreased energy 0 0  0   Change in appetite 0 0  0   Feeling bad or failure about yourself  0 0  0   Trouble concentrating 0 0  0   Moving slowly or fidgety/restless 0 0  0   Suicidal thoughts 0 0  0   PHQ-9 Score 0 0  0   Difficult doing work/chores Not difficult at all   Not difficult at all       Review of Systems:   Pertinent items are noted in HPI Denies any headaches, blurred vision, fatigue, shortness of breath, chest pain, abdominal pain, bowel movements, urination, or intercourse unless otherwise stated above.  Pertinent History Reviewed:  Reviewed past medical,surgical, social and family history.  Reviewed problem list, medications and allergies. Physical Assessment:   Vitals:   06/20/23 1013 06/20/23 1017  BP: (!) 189/91 (!) 175/91  Pulse: 80   Weight: 220 lb 9.6 oz (100.1 kg)   Height: 5' 8 (1.727 m)   Body mass index is 33.54 kg/m.        Physical Examination:   General appearance - well appearing, and in no distress  Mental status - alert, oriented to person, place, and time  Psych:  She has a normal mood and  affect  Skin - warm and dry, normal color, no suspicious lesions noted  Chest - effort normal, all lung fields clear to auscultation bilaterally  Heart - normal rate and regular rhythm  Neck:  midline trachea, no thyromegaly or nodules  Breasts - breasts appear normal, no suspicious masses, no skin or nipple changes or  axillary nodes  Abdomen - soft, nontender, nondistended, no masses or organomegaly  Pelvic - VULVA: normal appearing vulva with no masses, tenderness or lesions  VAGINA: normal appearing vagina with normal color and discharge, no lesions  CERVIX: normal appearing cervix without discharge or lesions, no CMT  Thin prep pap is done with HR HPV cotesting  UTERUS: uterus is felt to be normal size, shape, consistency and nontender   ADNEXA: No adnexal masses or tenderness noted.  Extremities:  No swelling or varicosities noted  Chaperone: Wendell Halt     Assessment & Plan:  1) Well-Woman Exam -pap collected today, reviewed ASCCP guidelines - Mammogram up-to-date - Colonoscopy up-to-date  2) elevated blood pressure - She notes that she was coming from [place] and was rushing to get to today's appointment - Patient will monitor at home and and is established  with PCP  No orders of the defined types were placed in this encounter.   Meds: No orders of the defined types were placed in this encounter.   Follow-up: Return in about 1 year (around 06/19/2024) for Annual.   Michah Minton, DO Attending Obstetrician & Gynecologist, [facility] for [facility], [facility]

## 2023-06-22 ENCOUNTER — Ambulatory Visit: Payer: Self-pay | Admitting: Obstetrics & Gynecology

## 2023-06-22 LAB — CYTOLOGY - PAP
Adequacy: ABSENT
Comment: NEGATIVE
Diagnosis: NEGATIVE
High risk HPV: NEGATIVE

## 2023-07-26 ENCOUNTER — Ambulatory Visit: Payer: Self-pay | Admitting: Internal Medicine

## 2023-07-26 ENCOUNTER — Encounter: Payer: Self-pay | Admitting: Internal Medicine

## 2023-07-26 VITALS — BP 148/82 | HR 76 | Ht 68.0 in | Wt 222.4 lb

## 2023-07-26 DIAGNOSIS — E559 Vitamin D deficiency, unspecified: Secondary | ICD-10-CM

## 2023-07-26 DIAGNOSIS — E782 Mixed hyperlipidemia: Secondary | ICD-10-CM | POA: Diagnosis not present

## 2023-07-26 DIAGNOSIS — N6081 Other benign mammary dysplasias of right breast: Secondary | ICD-10-CM

## 2023-07-26 DIAGNOSIS — E1169 Type 2 diabetes mellitus with other specified complication: Secondary | ICD-10-CM | POA: Diagnosis not present

## 2023-07-26 DIAGNOSIS — I10 Essential (primary) hypertension: Secondary | ICD-10-CM

## 2023-07-26 DIAGNOSIS — E119 Type 2 diabetes mellitus without complications: Secondary | ICD-10-CM

## 2023-07-26 DIAGNOSIS — Z7984 Long term (current) use of oral hypoglycemic drugs: Secondary | ICD-10-CM

## 2023-07-26 MED ORDER — OLMESARTAN MEDOXOMIL-HCTZ 40-25 MG PO TABS: 1.0000 | ORAL_TABLET | Freq: Every day | ORAL | 1 refills | Status: DC

## 2023-07-26 NOTE — Assessment & Plan Note (Deleted)
 Physical exam as documented. Counseling done  re healthy lifestyle involving commitment to 150 minutes exercise per week, heart healthy diet, and attaining healthy weight.The importance of adequate sleep also discussed. Immunization and cancer screening needs are specifically addressed at this visit.

## 2023-07-26 NOTE — Progress Notes (Signed)
 Established Patient Office Visit  Subjective:  Patient ID: Colleen Rowe, female    DOB: 1960-12-05  Age: 63 y.o. MRN: [id number]  CC:  Chief Complaint  Patient presents with   Skin Problem    Has a skin concern on her chest , noticed it last year.    Diabetes   Hypertension    HPI Colleen Rowe is a 63 y.o. female with past medical history of HTN, type II DM and morbid obesity who presents for f/u of her chronic medical conditions.  HTN: BP is elevated today. She has run out of olmesartan -HCTZ and amlodipine . Patient denies headache, dizziness, chest pain, dyspnea or palpitations.  Type II DM: Her HbA1c was elevated at 12.0 in 06/24.  She has run out of her metformin  and glipizide  for about 6 months.  She was placed on Mounjaro  and later Ozempic , but she was not able to get it due to insurance coverage concern. She reports that her blood glucose stays around 150-200 (?).  She also reports cutting down on soft drink intake. She had been taking at least 3 soft drinks in a day. She denies any polyuria or polydipsia currently. She also reports that she needs to improve her diet.  She has been using CPAP device regularly now.    Past Medical History:  Diagnosis Date   Allergies    Diabetes mellitus without complication (HCC)    Hypertension     Past Surgical History:  Procedure Laterality Date   CHOLECYSTECTOMY     COLONOSCOPY WITH PROPOFOL  N/A 10/21/2020   Procedure: COLONOSCOPY WITH PROPOFOL ;  Surgeon: Cindie Carlin POUR, DO;  Location: [facility];  Service: Endoscopy;  Laterality: N/A;  9:00 / ASA II   DENTAL SURGERY      Family History  Problem Relation Age of Onset   Breast cancer Mother    Diabetes Mother    Hypertension Mother    Breast cancer Sister    Colon cancer Neg Hx     Social History   Socioeconomic History   Marital status: Divorced    Spouse name: Not on file   Number of children: 2   Years of education: Not on file   Highest education  level: Not on file  Occupational History   Not on file  Tobacco Use   Smoking status: Never   Smokeless tobacco: Never  Vaping Use   Vaping status: Never Used  Substance and Sexual Activity   Alcohol use: Not Currently   Drug use: Never   Sexual activity: Not Currently    Birth control/protection: Post-menopausal  Other Topics Concern   Not on file  Social History Narrative   Not on file   Social Drivers of Health   Financial Resource Strain: Unknown (10/13/2020)   Overall Financial Resource Strain (CARDIA)    Difficulty of Paying Living Expenses: Patient declined  Food Insecurity: Unknown (10/13/2020)   Hunger Vital Sign    Worried About Running Out of Food in the Last Year: Patient declined    Ran Out of Food in the Last Year: Patient declined  Transportation Needs: No Transportation Needs (10/13/2020)   PRAPARE - Administrator, Civil Service (Medical): No    Lack of Transportation (Non-Medical): No  Physical Activity: Sufficiently Active (10/13/2020)   Exercise Vital Sign    Days of Exercise per Week: 4 days    Minutes of Exercise per Session: 100 min  Stress: No Stress Concern Present (10/13/2020)  [employer] of Occupational Health - Occupational Stress Questionnaire    Feeling of Stress : Not at all  Social Connections: Unknown (10/13/2020)   Social Connection and Isolation Panel    Frequency of Communication with Friends and Family: Patient declined    Frequency of Social Gatherings with Friends and Family: Patient declined    Attends Religious Services: Patient declined    Database administrator or Organizations: No    Attends Banker Meetings: Never    Marital Status: Divorced  Catering manager Violence: Not At Risk (10/13/2020)   Humiliation, Afraid, Rape, and Kick questionnaire    Fear of Current or Ex-Partner: No    Emotionally Abused: No    Physically Abused: No    Sexually Abused: No    Outpatient Medications Prior to  Visit  Medication Sig Dispense Refill   acetaminophen  (TYLENOL ) 500 MG tablet Take 500-1,000 mg by mouth every 6 (six) hours as needed for moderate pain.     Blood Glucose Monitoring Suppl (ACCU-CHEK GUIDE ME) w/Device KIT DISPENSE BASED ON PATIENT AND INSURANCE PREFERENCE. USE UP TO FOUR TIMES DAILY AS DIRECTED. (FOR ICD-10 E10.9, E11.9). 1 kit 0   Calcium  Carbonate Antacid (ALKA-SELTZER HEARTBURN PO) Take 2 tablets by mouth daily as needed (heartburn).     Cholecalciferol (VITAMIN D3) 50 MCG (2000 UT) TABS Take 2,000 Units by mouth daily.     ferrous gluconate (FERGON) 240 (27 FE) MG tablet Take 480 mg by mouth daily.     fexofenadine (ALLEGRA) 180 MG tablet Take 180 mg by mouth daily as needed for allergies or rhinitis.     glipiZIDE  (GLUCOTROL ) 5 MG tablet Take 1 tablet (5 mg total) by mouth 2 (two) times daily before a meal. 60 tablet 5   glucose blood test strip Check sugar 3 times a day 100 each 12   meloxicam  (MOBIC ) 7.5 MG tablet Take 1 tablet (7.5 mg total) by mouth daily. 30 tablet 2   metFORMIN  (GLUCOPHAGE ) 500 MG tablet Take 1 tablet (500 mg total) by mouth 2 (two) times daily with a meal. 180 tablet 1   Simethicone (GAS-X PO) Take 1 tablet by mouth daily as needed (gas).     vitamin B-12 (CYANOCOBALAMIN) 500 MCG tablet Take 500 mcg by mouth daily.     amLODipine  (NORVASC ) 10 MG tablet Take 1 tablet (10 mg total) by mouth daily. 90 tablet 1   olmesartan -hydrochlorothiazide (BENICAR  HCT) 40-25 MG tablet Take 1 tablet by mouth daily. 90 tablet 1   No facility-administered medications prior to visit.    Allergies  Allergen Reactions   Benadryl [Diphenhydramine]     SOB    ROS Review of Systems  Constitutional:  Negative for chills and fever.  HENT:  Negative for congestion, sinus pressure, sinus pain and sore throat.   Eyes:  Negative for pain and discharge.  Respiratory:  Negative for cough and shortness of breath.   Cardiovascular:  Positive for leg swelling. Negative for  chest pain and palpitations.  Gastrointestinal:  Negative for abdominal pain, constipation, diarrhea, nausea and vomiting.  Endocrine: Negative for polydipsia and polyuria.  Genitourinary:  Negative for dysuria and hematuria.  Musculoskeletal:  Negative for neck pain and neck stiffness.  Skin:  Negative for rash.  Neurological:  Negative for dizziness and weakness.  Psychiatric/Behavioral:  Negative for agitation and behavioral problems.       Objective:    Physical Exam Vitals reviewed.  Constitutional:      General: She is  not in acute distress.    Appearance: She is not diaphoretic.  HENT:     Head: Normocephalic and atraumatic.     Nose: Nose normal.     Mouth/Throat:     Mouth: Mucous membranes are moist.  Eyes:     General: No scleral icterus.    Extraocular Movements: Extraocular movements intact.  Cardiovascular:     Rate and Rhythm: Normal rate and regular rhythm.     Heart sounds: Normal heart sounds. No murmur heard. Pulmonary:     Breath sounds: Normal breath sounds. No wheezing or rales.  Abdominal:     Palpations: Abdomen is soft.     Tenderness: There is no abdominal tenderness.  Musculoskeletal:     Cervical back: Neck supple. No tenderness.     Right lower leg: No edema.     Left lower leg: No edema.  Feet:     Left foot:     Skin integrity: Dry skin present.  Skin:    General: Skin is warm.     Findings: No rash.     Comments: About 1.5 cm in diameter cystic skin lesion over medial side of right breast  Neurological:     General: No focal deficit present.     Mental Status: She is alert and oriented to person, place, and time.     Sensory: No sensory deficit.     Motor: No weakness.  Psychiatric:        Mood and Affect: Mood normal.        Behavior: Behavior normal.     BP (!) 148/82 (BP Location: Left Arm)   Pulse 76   Ht 5' 8 (1.727 m)   Wt 222 lb 6.4 oz (100.9 kg)   SpO2 93%   BMI 33.82 kg/m  Wt Readings from Last 3 Encounters:   07/26/23 222 lb 6.4 oz (100.9 kg)  06/20/23 220 lb 9.6 oz (100.1 kg)  04/01/23 227 lb (103 kg)    Lab Results  Component Value Date   TSH 0.588 03/08/2022   Lab Results  Component Value Date   WBC 7.8 03/08/2022   HGB 13.1 03/08/2022   HCT 41.3 03/08/2022   MCV 82 03/08/2022   PLT 197 03/08/2022   Lab Results  Component Value Date   NA 140 06/10/2022   K 4.1 06/10/2022   CO2 22 06/10/2022   GLUCOSE 187 (H) 06/10/2022   BUN 20 06/10/2022   CREATININE 0.87 06/10/2022   BILITOT 0.3 06/10/2022   ALKPHOS 148 (H) 06/10/2022   AST 28 06/10/2022   ALT 34 (H) 06/10/2022   PROT 7.6 06/10/2022   ALBUMIN 4.3 06/10/2022   CALCIUM  9.5 06/10/2022   ANIONGAP 9 03/28/2014   EGFR 75 06/10/2022   Lab Results  Component Value Date   CHOL 133 03/08/2022   Lab Results  Component Value Date   HDL 39 (L) 03/08/2022   Lab Results  Component Value Date   LDLCALC 65 03/08/2022   Lab Results  Component Value Date   TRIG 172 (H) 03/08/2022   Lab Results  Component Value Date   CHOLHDL 3.4 03/08/2022   Lab Results  Component Value Date   HGBA1C 12.0 (H) 06/10/2022      Assessment & Plan:   Problem List Items Addressed This Visit       Cardiovascular and Mediastinum   Essential hypertension   BP Readings from Last 1 Encounters:  07/26/23 (!) 148/82   Uncontrolled due to noncompliance  Restart olmesartan -hydrochlorothiazide 40-25 mg QD for now, discontinue Amlodipine  10 mg QD considering current BP without any antihypertensive Counseled for compliance with the medications Advised DASH diet and moderate exercise/walking, at least 150 mins/week Needs to cut down soft drink intake      Relevant Medications   olmesartan -hydrochlorothiazide (BENICAR  HCT) 40-25 MG tablet   Other Relevant Orders   TSH   CMP14+EGFR   CBC with Differential/Platelet     Endocrine   Type 2 diabetes mellitus with other specified complication (HCC) - Primary   Lab Results  Component Value  Date   HGBA1C 12.0 (H) 06/10/2022   Uncontrolled due to noncompliance to medications and diet Associated with HTN, OSA and obesity Was on metformin  500 mg BID and glipizide  5 mg BID, needs to be compliant Tried to start GLP-1 agonist, but Ozempic  and Mounjaro  were not covered - had added Trulicity , but she lost follow up  Will check HbA1c and send prescriptions accordingly Advised to follow diabetic diet -needs to cut down soft drink intake Once she is compliant to current regimen, will start statin - last lipid profile showed LDL less than 70, On ARB F/u CMP and lipid panel Diabetic eye exam: Advised to follow up with Ophthalmology for diabetic eye exam      Relevant Medications   olmesartan -hydrochlorothiazide (BENICAR  HCT) 40-25 MG tablet   Other Relevant Orders   Hemoglobin A1c   CMP14+EGFR   Urine Microalbumin w/creat. ratio     Musculoskeletal and Integument   Sebaceous cyst of skin of right breast   Skin lesion likely benign cyst Reassured it being benign If increases in size, will refer to dermatology        Other   Mixed hyperlipidemia   Last lipid profile showed LDL less than 70 Advised to follow low-carb, low cholesterol diet for now      Relevant Medications   olmesartan -hydrochlorothiazide (BENICAR  HCT) 40-25 MG tablet   Other Relevant Orders   Lipid panel   Other Visit Diagnoses       Vitamin D  deficiency       Relevant Orders   VITAMIN D  25 Hydroxy (Vit-D Deficiency, Fractures)      Meds ordered this encounter  Medications   olmesartan -hydrochlorothiazide (BENICAR  HCT) 40-25 MG tablet    Sig: Take 1 tablet by mouth daily.    Dispense:  90 tablet    Refill:  1    Follow-up: Return in about 2 months (around 09/26/2023) for DM and HTN.    Suzzane MARLA Blanch, MD

## 2023-07-26 NOTE — Assessment & Plan Note (Addendum)
 Lab Results  Component Value Date   HGBA1C 12.0 (H) 06/10/2022   Uncontrolled due to noncompliance to medications and diet Associated with HTN, OSA and obesity Was on metformin  500 mg BID and glipizide  5 mg BID, needs to be compliant Tried to start GLP-1 agonist, but Ozempic  and Mounjaro  were not covered - had added Trulicity , but she lost follow up  Will check HbA1c and send prescriptions accordingly Advised to follow diabetic diet -needs to cut down soft drink intake Once she is compliant to current regimen, will start statin - last lipid profile showed LDL less than 70, On ARB F/u CMP and lipid panel Diabetic eye exam: Advised to follow up with Ophthalmology for diabetic eye exam

## 2023-07-26 NOTE — Assessment & Plan Note (Signed)
 Skin lesion likely benign cyst Reassured it being benign If increases in size, will refer to dermatology

## 2023-07-26 NOTE — Assessment & Plan Note (Signed)
Last lipid profile showed LDL less than 70 Advised to follow low-carb, low cholesterol diet for now

## 2023-07-26 NOTE — Assessment & Plan Note (Addendum)
 BP Readings from Last 1 Encounters:  07/26/23 (!) 148/82   Uncontrolled due to noncompliance Restart olmesartan -hydrochlorothiazide 40-25 mg QD for now, discontinue Amlodipine  10 mg QD considering current BP without any antihypertensive Counseled for compliance with the medications Advised DASH diet and moderate exercise/walking, at least 150 mins/week Needs to cut down soft drink intake

## 2023-07-26 NOTE — Patient Instructions (Signed)
 Please start taking Olmesartan -hydrochlorothiazide as prescribed for blood pressure.  Please continue to take medications as prescribed.  Please continue to follow low carb diet and perform moderate exercise/walking at least 150 mins/week.

## 2023-07-27 ENCOUNTER — Ambulatory Visit: Payer: Self-pay | Admitting: Internal Medicine

## 2023-07-27 ENCOUNTER — Other Ambulatory Visit: Payer: Self-pay

## 2023-07-27 ENCOUNTER — Other Ambulatory Visit: Payer: Self-pay | Admitting: Internal Medicine

## 2023-07-27 DIAGNOSIS — E0590 Thyrotoxicosis, unspecified without thyrotoxic crisis or storm: Secondary | ICD-10-CM

## 2023-07-27 DIAGNOSIS — R7989 Other specified abnormal findings of blood chemistry: Secondary | ICD-10-CM

## 2023-07-27 DIAGNOSIS — E1169 Type 2 diabetes mellitus with other specified complication: Secondary | ICD-10-CM

## 2023-07-27 LAB — CBC WITH DIFFERENTIAL/PLATELET
Basophils Absolute: 0 x10E3/uL (ref 0.0–0.2)
Basos: 1 %
EOS (ABSOLUTE): 0.2 x10E3/uL (ref 0.0–0.4)
Eos: 3 %
Hematocrit: 42.9 % (ref 34.0–46.6)
Hemoglobin: 13.1 g/dL (ref 11.1–15.9)
Immature Grans (Abs): 0 x10E3/uL (ref 0.0–0.1)
Immature Granulocytes: 0 %
Lymphocytes Absolute: 2.9 x10E3/uL (ref 0.7–3.1)
Lymphs: 40 %
MCH: 26.1 pg — ABNORMAL LOW (ref 26.6–33.0)
MCHC: 30.5 g/dL — ABNORMAL LOW (ref 31.5–35.7)
MCV: 86 fL (ref 79–97)
Monocytes Absolute: 0.5 x10E3/uL (ref 0.1–0.9)
Monocytes: 6 %
Neutrophils Absolute: 3.7 x10E3/uL (ref 1.4–7.0)
Neutrophils: 50 %
Platelets: 223 x10E3/uL (ref 150–450)
RBC: 5.02 x10E6/uL (ref 3.77–5.28)
RDW: 12.1 % (ref 11.7–15.4)
WBC: 7.4 x10E3/uL (ref 3.4–10.8)

## 2023-07-27 LAB — CMP14+EGFR
ALT: 33 IU/L — ABNORMAL HIGH (ref 0–32)
AST: 21 IU/L (ref 0–40)
Albumin: 4 g/dL (ref 3.9–4.9)
Alkaline Phosphatase: 169 IU/L — ABNORMAL HIGH (ref 44–121)
BUN/Creatinine Ratio: 14 (ref 12–28)
BUN: 12 mg/dL (ref 8–27)
Bilirubin Total: 0.3 mg/dL (ref 0.0–1.2)
CO2: 20 mmol/L (ref 20–29)
Calcium: 9.3 mg/dL (ref 8.7–10.3)
Chloride: 100 mmol/L (ref 96–106)
Creatinine, Ser: 0.86 mg/dL (ref 0.57–1.00)
Globulin, Total: 2.9 g/dL (ref 1.5–4.5)
Glucose: 416 mg/dL — ABNORMAL HIGH (ref 70–99)
Potassium: 4.2 mmol/L (ref 3.5–5.2)
Sodium: 135 mmol/L (ref 134–144)
Total Protein: 6.9 g/dL (ref 6.0–8.5)
eGFR: 76 mL/min/1.73 (ref >59)

## 2023-07-27 LAB — LIPID PANEL
Chol/HDL Ratio: 3.1 ratio (ref 0.0–4.4)
Cholesterol, Total: 127 mg/dL (ref 100–199)
HDL: 41 mg/dL (ref >39)
LDL Chol Calc (NIH): 64 mg/dL (ref 0–99)
Triglycerides: 126 mg/dL (ref 0–149)
VLDL Cholesterol Cal: 22 mg/dL (ref 5–40)

## 2023-07-27 LAB — HEMOGLOBIN A1C
Est. average glucose Bld gHb Est-mCnc: 335 mg/dL
Hgb A1c MFr Bld: 13.3 % — ABNORMAL HIGH (ref 4.8–5.6)

## 2023-07-27 LAB — TSH: TSH: 0.009 u[IU]/mL — ABNORMAL LOW (ref 0.450–4.500)

## 2023-07-27 LAB — VITAMIN D 25 HYDROXY (VIT D DEFICIENCY, FRACTURES): Vit D, 25-Hydroxy: 34.6 ng/mL (ref 30.0–100.0)

## 2023-07-27 MED ORDER — GLIPIZIDE 10 MG PO TABS: 10.0000 mg | ORAL_TABLET | Freq: Two times a day (BID) | ORAL | 2 refills | Status: DC

## 2023-07-27 MED ORDER — METFORMIN HCL 500 MG PO TABS: 500.0000 mg | ORAL_TABLET | Freq: Two times a day (BID) | ORAL | 1 refills | Status: DC

## 2023-07-27 NOTE — Addendum Note (Signed)
 Addended byBETHA TOBIE DOWNS on: 07/27/2023 07:55 AM   Modules accepted: Orders

## 2023-07-28 ENCOUNTER — Telehealth: Payer: Self-pay

## 2023-07-28 LAB — MICROALBUMIN / CREATININE URINE RATIO
Creatinine, Urine: 64.1 mg/dL
Microalb/Creat Ratio: 10 mg/g{creat} (ref 0–29)
Microalbumin, Urine: 6.3 ug/mL

## 2023-07-28 NOTE — Telephone Encounter (Signed)
 Copied from CRM [id number]. Topic: Clinical - Medical Advice >> Jul 28, 2023 10:11 AM Antwanette L wrote: Reason for CRM: Please Dr. Tobie that the patient wants to have surgery to remove the cyst. The patient can be contacted at [phone].

## 2023-07-28 NOTE — Addendum Note (Signed)
 Addended byBETHA TOBIE DOWNS on: 07/28/2023 05:10 PM   Modules accepted: Orders

## 2023-07-29 NOTE — Telephone Encounter (Signed)
 Patient advised.

## 2023-07-30 LAB — SPECIMEN STATUS REPORT

## 2023-07-30 LAB — T4, FREE: Free T4: 1.34 ng/dL (ref 0.82–1.77)

## 2023-08-06 ENCOUNTER — Emergency Department (HOSPITAL_COMMUNITY): Admission: EM | Admit: 2023-08-06 | Discharge: 2023-08-06 | Disposition: A | Discharge status: Home / Self Care

## 2023-08-06 ENCOUNTER — Encounter (HOSPITAL_COMMUNITY): Payer: Self-pay

## 2023-08-06 ENCOUNTER — Emergency Department (HOSPITAL_COMMUNITY)

## 2023-08-06 ENCOUNTER — Other Ambulatory Visit: Payer: Self-pay

## 2023-08-06 DIAGNOSIS — E119 Type 2 diabetes mellitus without complications: Secondary | ICD-10-CM | POA: Insufficient documentation

## 2023-08-06 DIAGNOSIS — I10 Essential (primary) hypertension: Secondary | ICD-10-CM | POA: Insufficient documentation

## 2023-08-06 DIAGNOSIS — Z7984 Long term (current) use of oral hypoglycemic drugs: Secondary | ICD-10-CM | POA: Diagnosis not present

## 2023-08-06 DIAGNOSIS — Z79899 Other long term (current) drug therapy: Secondary | ICD-10-CM | POA: Diagnosis not present

## 2023-08-06 LAB — BASIC METABOLIC PANEL WITH GFR
Anion gap: 12 (ref 5–15)
BUN: 9 mg/dL (ref 8–23)
CO2: 24 mmol/L (ref 22–32)
Calcium: 9.2 mg/dL (ref 8.9–10.3)
Chloride: 101 mmol/L (ref 98–111)
Creatinine, Ser: 0.74 mg/dL (ref 0.44–1.00)
GFR, Estimated: >60 mL/min (ref >60)
Glucose, Bld: 273 mg/dL — ABNORMAL HIGH (ref 70–99)
Potassium: 3.5 mmol/L (ref 3.5–5.1)
Sodium: 137 mmol/L (ref 135–145)

## 2023-08-06 LAB — CBC
HCT: 46.1 % — ABNORMAL HIGH (ref 36.0–46.0)
Hemoglobin: 14.7 g/dL (ref 12.0–15.0)
MCH: 26.3 pg (ref 26.0–34.0)
MCHC: 31.9 g/dL (ref 30.0–36.0)
MCV: 82.3 fL (ref 80.0–100.0)
Platelets: 261 K/uL (ref 150–400)
RBC: 5.6 MIL/uL — ABNORMAL HIGH (ref 3.87–5.11)
RDW: 12.9 % (ref 11.5–15.5)
WBC: 9 K/uL (ref 4.0–10.5)
nRBC: 0 % (ref 0.0–0.2)

## 2023-08-06 NOTE — ED Provider Notes (Signed)
 [place] [facility] AT [facility] Provider Note   CSN: [id number] Arrival date & time: 08/06/23  1120     Patient presents with: Hypertension   Colleen Rowe is a 63 y.o. female with a history including hypertension, sleep apnea, type 2 diabetes and hyperlipidemia presenting for evaluation of elevated blood pressure.  She endorses actually been out of her blood pressure medications for approximately 1 year stating she has been unable to afford them, but had an office visit with her PCP this week now that she has insurance once again and was given a new prescription of her Benicar .  At home this morning she woke with blurred vision in her left eye which has since resolved, however it concerned her enough to check her blood pressure and it was 214/101.  She immediately proceeded to her pharmacy and picked up her Benicar  and took her first dose of this medication before arrival but the pharmacist insistence she checked in here for further evaluation.  She denies headache, chest pain, shortness of breath, peripheral edema.  {Add pertinent medical, surgical, social history, OB history to YEP:67052} The history is provided by the patient.       Prior to Admission medications   Medication Sig Start Date End Date Taking? Authorizing Provider  acetaminophen  (TYLENOL ) 500 MG tablet Take 500-1,000 mg by mouth every 6 (six) hours as needed for moderate pain.    [provider]  Blood Glucose Monitoring Suppl (ACCU-CHEK GUIDE ME) w/Device KIT DISPENSE BASED ON PATIENT AND INSURANCE PREFERENCE. USE UP TO FOUR TIMES DAILY AS DIRECTED. (FOR ICD-10 E10.9, E11.9). 08/18/22   Tobie Suzzane POUR, MD  Calcium  Carbonate Antacid (ALKA-SELTZER HEARTBURN PO) Take 2 tablets by mouth daily as needed (heartburn).    [provider]  Cholecalciferol (VITAMIN D3) 50 MCG (2000 UT) TABS Take 2,000 Units by mouth daily.    [provider]  ferrous gluconate (FERGON) 240 (27 FE)  MG tablet Take 480 mg by mouth daily.    [provider]  fexofenadine (ALLEGRA) 180 MG tablet Take 180 mg by mouth daily as needed for allergies or rhinitis.    [provider]  glipiZIDE  (GLUCOTROL ) 10 MG tablet Take 1 tablet (10 mg total) by mouth 2 (two) times daily before a meal. 07/27/23   Tobie, Suzzane POUR, MD  glucose blood test strip Check sugar 3 times a day 09/15/22   Tobie Suzzane POUR, MD  meloxicam  (MOBIC ) 7.5 MG tablet Take 1 tablet (7.5 mg total) by mouth daily. 08/17/22   Tobie Suzzane POUR, MD  metFORMIN  (GLUCOPHAGE ) 500 MG tablet Take 1 tablet (500 mg total) by mouth 2 (two) times daily with a meal. 07/27/23   Tobie Suzzane POUR, MD  olmesartan -hydrochlorothiazide (BENICAR  HCT) 40-25 MG tablet Take 1 tablet by mouth daily. 07/26/23   Patel, Rutwik K, MD  Simethicone (GAS-X PO) Take 1 tablet by mouth daily as needed (gas).    [provider]  vitamin B-12 (CYANOCOBALAMIN) 500 MCG tablet Take 500 mcg by mouth daily.    [provider]    Allergies: Benadryl [diphenhydramine]    Review of Systems  Constitutional:  Negative for fever.  HENT:  Negative for congestion.   Eyes:  Positive for visual disturbance.  Respiratory:  Negative for chest tightness and shortness of breath.   Cardiovascular:  Negative for chest pain, palpitations and leg swelling.  Gastrointestinal:  Negative for abdominal pain and nausea.  Genitourinary: Negative.   Musculoskeletal:  Negative for  arthralgias, joint swelling and neck pain.  Skin: Negative.  Negative for rash and wound.  Neurological:  Negative for dizziness, weakness, light-headedness, numbness and headaches.  Psychiatric/Behavioral: Negative.      Updated Vital Signs BP (!) 168/84   Pulse 73   Temp 97.9 F (36.6 C) (Oral)   Resp 20   Ht 5' 8 (1.727 m)   Wt 101 kg   SpO2 92%   BMI 33.86 kg/m   Physical Exam Vitals and nursing note reviewed.  Constitutional:      Appearance: She is well-developed.  HENT:      Head: Normocephalic and atraumatic.  Eyes:     Extraocular Movements: Extraocular movements intact.     Conjunctiva/sclera: Conjunctivae normal.     Pupils: Pupils are equal, round, and reactive to light.     Comments: Visual acuity 20/50 OS, 20/70 OD, 20/70 OU.  Cardiovascular:     Rate and Rhythm: Normal rate and regular rhythm.     Pulses: Normal pulses.     Heart sounds: Normal heart sounds.  Pulmonary:     Effort: Pulmonary effort is normal.     Breath sounds: Normal breath sounds. No rales.  Abdominal:     General: Bowel sounds are normal.     Palpations: Abdomen is soft.     Tenderness: There is no abdominal tenderness.  Musculoskeletal:        General: Normal range of motion.     Cervical back: Normal range of motion.     Right lower leg: No edema.     Left lower leg: No edema.  Skin:    General: Skin is warm and dry.  Neurological:     Mental Status: She is alert.     (all labs ordered are listed, but only abnormal results are displayed) Labs Reviewed  CBC - Abnormal; Notable for the following components:      Result Value   RBC 5.60 (*)    HCT 46.1 (*)    All other components within normal limits  BASIC METABOLIC PANEL WITH GFR - Abnormal; Notable for the following components:   Glucose, Bld 273 (*)    All other components within normal limits    EKG: EKG Interpretation Date/Time:  Saturday August 06 2023 12:32:56 EDT Ventricular Rate:  81 PR Interval:    QRS Duration:  100 QT Interval:  403 QTC Calculation: 468 R Axis:   56  Text Interpretation: Atrial fibrillation Nonspecific T abnormalities, diffuse leads Artifact in lead(s) I II III aVR aVL aVF V3 V4 V5 V6 Confirmed by Simon Rea [phone]) on 08/06/2023 12:36:06 PM  Radiology: [facility] Chest Portable 1 View Result Date: 08/06/2023 CLINICAL DATA:  Hypertension.  Blurry vision. EXAM: PORTABLE CHEST 1 VIEW COMPARISON:  03/28/2014. FINDINGS: Low lung volume. Bilateral lung fields are clear. Bilateral  costophrenic angles are clear. Normal cardio-mediastinal silhouette. No acute osseous abnormalities. The soft tissues are within normal limits. IMPRESSION: No active disease. Electronically Signed   By: Ree Molt M.D.   On: 08/06/2023 13:39    {Document cardiac monitor, telemetry assessment procedure when appropriate:32947} Procedures   Medications Ordered in the ED - No data to display    {Click here for ABCD2, HEART and other calculators REFRESH Note before signing:1}                              Medical Decision Making Patient presenting for evaluation of high blood pressure  who had blurred vision left eye prior to arrival without eye pain, headache, nausea or vomiting, checked her blood pressure and was significantly elevated at 214/101.  Had been out of her blood pressure medications x 1 year had recently reestablished with her PCP who had prescribed her Benicar  but patient had not picked up until today when her symptoms began, took her dose prior to arrival.  Blurred vision is now gone, she has no complaints of symptoms at this time.  Visual acuity was obtained and she does have some deficit but she states this is baseline for her.  She may need to see ophthalmology for official visual exam, patient states she used to wear glasses years ago has not in recent years but she may benefit and she agrees to do this.  There is no sign of endorgan damage on today's exam, she denies headache, she has no neurodeficits or other concerning symptoms at this time.  Labs and imaging are reassuring.  She was sent home with instructions to make sure she is taking her medication daily.  Patient states she has an appointment with her PCP next week for a blood pressure recheck.  Amount and/or Complexity of Data Reviewed Labs: ordered.    Details: Pertinent labs including a normal creatinine at 0.74.  Glucose 273 without anion gap. Radiology: ordered.    Details: Chest x-ray reviewed, no  cardiomegaly ECG/medicine tests: ordered.     {Document critical care time when appropriate  Document review of labs and clinical decision tools ie CHADS2VASC2, etc  Document your independent review of radiology images and any outside records  Document your discussion with family members, caretakers and with consultants  Document social determinants of health affecting pt's care  Document your decision making why or why not admission, treatments were needed:32947:::1}   Final diagnoses:  Primary hypertension    ED Discharge Orders     None

## 2023-08-06 NOTE — Discharge Instructions (Signed)
 Your lab tests, EKG and chest x-ray are all reassuring today.  Make sure you are taking your blood pressure medications and do not run out of these medications.  Keep your appointment with Dr. Tobie next week for a blood pressure recheck.  Your visual acuity is not normal today but as you state it is baseline for you I recommend evaluated by ophthalmology, I suspect you would greatly benefit from wearing glasses.

## 2023-08-06 NOTE — ED Triage Notes (Signed)
 Pt stated that she took her BP this morning and it was reading 214/101 and she is also having blurry vision. Pt did take her medication this morning

## 2023-08-09 ENCOUNTER — Ambulatory Visit: Payer: Self-pay | Admitting: Internal Medicine

## 2023-08-09 ENCOUNTER — Encounter: Payer: Self-pay | Admitting: Internal Medicine

## 2023-08-09 VITALS — BP 118/75 | HR 77 | Ht 68.0 in | Wt 216.6 lb

## 2023-08-09 DIAGNOSIS — Z09 Encounter for follow-up examination after completed treatment for conditions other than malignant neoplasm: Secondary | ICD-10-CM

## 2023-08-09 DIAGNOSIS — I10 Essential (primary) hypertension: Secondary | ICD-10-CM | POA: Diagnosis not present

## 2023-08-09 DIAGNOSIS — E0590 Thyrotoxicosis, unspecified without thyrotoxic crisis or storm: Secondary | ICD-10-CM | POA: Diagnosis not present

## 2023-08-09 DIAGNOSIS — Z7984 Long term (current) use of oral hypoglycemic drugs: Secondary | ICD-10-CM

## 2023-08-09 DIAGNOSIS — E1169 Type 2 diabetes mellitus with other specified complication: Secondary | ICD-10-CM | POA: Diagnosis not present

## 2023-08-09 DIAGNOSIS — H538 Other visual disturbances: Secondary | ICD-10-CM

## 2023-08-09 NOTE — Assessment & Plan Note (Signed)
 Lab Results  Component Value Date   HGBA1C 13.3 (H) 07/26/2023   Uncontrolled due to noncompliance to medications and diet Associated with HTN, OSA and obesity Recently restarted metformin  500 mg BID, but has not picked up glipizide  5 mg BID yet, needs to be compliant Tried to start GLP-1 agonist, but Ozempic  and Mounjaro  were not covered - had added Trulicity , but she lost follow up  Referred to Endocrinology Advised to follow diabetic diet -needs to cut down soft drink intake Once she is compliant to current regimen, will start statin - last lipid profile showed LDL less than 70, On ARB F/u CMP and lipid panel Diabetic eye exam: Advised to follow up with Ophthalmology for diabetic eye exam

## 2023-08-09 NOTE — Assessment & Plan Note (Signed)
 ER visit for hypertensive urgency BP has improved currently with Benicar , needs to be compliant Advised to follow low-salt diet EKG in ER was read as atrial fibrillation, but likely had lead placement issue, not clear enough to distinguish P waves EKG in the office today showed sinus rhythm - she is asymptomatic

## 2023-08-09 NOTE — Patient Instructions (Addendum)
 Please call [phone] to make an appointment with Endocrinology.  Please continue to take medications as prescribed.  Please continue to follow low carb diet and perform moderate exercise/walking at least 150 mins/week.

## 2023-08-09 NOTE — Assessment & Plan Note (Addendum)
 Lab Results  Component Value Date   TSH 0.009 (L) 07/26/2023   With free T4 wnl Referred to Endocrinology

## 2023-08-09 NOTE — Assessment & Plan Note (Signed)
 Likely due to uncontrolled type II DM and HTN Advised to contact optometry offices in [place]

## 2023-08-09 NOTE — Progress Notes (Signed)
 Established Patient Office Visit  Subjective:  Patient ID: Colleen Rowe, female    DOB: 07-Jun-1960  Age: 63 y.o. MRN: [id number]  CC:  Chief Complaint  Patient presents with   Follow-up    ER follow up for hypertension     HPI Colleen Rowe is a 63 y.o. female with past medical history of HTN, type II DM and morbid obesity who presents for f/u of her chronic medical conditions and follow up of recent ER visit.  She went to ER on 08/02 for elevated BP, up to 214/101 at home. She was advised to go to ER by Pharmacist. In ER, her BP was 168/84. Of note, she had started taking Benicar  on the day of ER visit only. She had left eye blurred vision as well, which had improved by the time she went to ER.  She denied any focal numbness or tingling.  HTN: BP is wnl today. She has started taking olmesartan -HCTZ now. Patient denies headache, dizziness, chest pain, dyspnea or palpitations.  Type II DM: Her HbA1c was elevated at 13.3 in 07/25.  She had run out of her metformin  and glipizide  for about 6 months, but has recently started taking it.  She was placed on Mounjaro  and later Ozempic , but she was not able to get it due to insurance coverage concern. She reports that her blood glucose stays around 150-200 (?).  She also reports cutting down on soft drink intake. She had been taking at least 3 soft drinks in a day. She denies any polyuria or polydipsia currently. She also reports that she needs to improve her diet.  She has been using CPAP device regularly now.    Past Medical History:  Diagnosis Date   Allergies    Diabetes mellitus without complication (HCC)    Hypertension     Past Surgical History:  Procedure Laterality Date   CHOLECYSTECTOMY     COLONOSCOPY WITH PROPOFOL  N/A 10/21/2020   Procedure: COLONOSCOPY WITH PROPOFOL ;  Surgeon: Cindie Carlin POUR, DO;  Location: [facility];  Service: Endoscopy;  Laterality: N/A;  9:00 / ASA II   DENTAL SURGERY      Family History   Problem Relation Age of Onset   Breast cancer Mother    Diabetes Mother    Hypertension Mother    Breast cancer Sister    Colon cancer Neg Hx     Social History   Socioeconomic History   Marital status: Divorced    Spouse name: Not on file   Number of children: 2   Years of education: Not on file   Highest education level: Not on file  Occupational History   Not on file  Tobacco Use   Smoking status: Never   Smokeless tobacco: Never  Vaping Use   Vaping status: Never Used  Substance and Sexual Activity   Alcohol use: Not Currently   Drug use: Never   Sexual activity: Not Currently    Birth control/protection: Post-menopausal  Other Topics Concern   Not on file  Social History Narrative   Not on file   Social Drivers of Health   Financial Resource Strain: Unknown (10/13/2020)   Overall Financial Resource Strain (CARDIA)    Difficulty of Paying Living Expenses: Patient declined  Food Insecurity: Unknown (10/13/2020)   Hunger Vital Sign    Worried About Running Out of Food in the Last Year: Patient declined    Ran Out of Food in the Last Year: Patient declined  Transportation Needs: No Transportation Needs (10/13/2020)   PRAPARE - Administrator, Civil Service (Medical): No    Lack of Transportation (Non-Medical): No  Physical Activity: Sufficiently Active (10/13/2020)   Exercise Vital Sign    Days of Exercise per Week: 4 days    Minutes of Exercise per Session: 100 min  Stress: No Stress Concern Present (10/13/2020)   [employer] of Occupational Health - Occupational Stress Questionnaire    Feeling of Stress : Not at all  Social Connections: Unknown (10/13/2020)   Social Connection and Isolation Panel    Frequency of Communication with Friends and Family: Patient declined    Frequency of Social Gatherings with Friends and Family: Patient declined    Attends Religious Services: Patient declined    Database administrator or Organizations: No     Attends Banker Meetings: Never    Marital Status: Divorced  Catering manager Violence: Not At Risk (10/13/2020)   Humiliation, Afraid, Rape, and Kick questionnaire    Fear of Current or Ex-Partner: No    Emotionally Abused: No    Physically Abused: No    Sexually Abused: No    Outpatient Medications Prior to Visit  Medication Sig Dispense Refill   acetaminophen  (TYLENOL ) 500 MG tablet Take 500-1,000 mg by mouth every 6 (six) hours as needed for moderate pain.     Blood Glucose Monitoring Suppl (ACCU-CHEK GUIDE ME) w/Device KIT DISPENSE BASED ON PATIENT AND INSURANCE PREFERENCE. USE UP TO FOUR TIMES DAILY AS DIRECTED. (FOR ICD-10 E10.9, E11.9). 1 kit 0   Calcium  Carbonate Antacid (ALKA-SELTZER HEARTBURN PO) Take 2 tablets by mouth daily as needed (heartburn).     Cholecalciferol (VITAMIN D3) 50 MCG (2000 UT) TABS Take 2,000 Units by mouth daily.     ferrous gluconate (FERGON) 240 (27 FE) MG tablet Take 480 mg by mouth daily.     fexofenadine (ALLEGRA) 180 MG tablet Take 180 mg by mouth daily as needed for allergies or rhinitis.     glipiZIDE  (GLUCOTROL ) 10 MG tablet Take 1 tablet (10 mg total) by mouth 2 (two) times daily before a meal. 60 tablet 2   glucose blood test strip Check sugar 3 times a day 100 each 12   meloxicam  (MOBIC ) 7.5 MG tablet Take 1 tablet (7.5 mg total) by mouth daily. 30 tablet 2   metFORMIN  (GLUCOPHAGE ) 500 MG tablet Take 1 tablet (500 mg total) by mouth 2 (two) times daily with a meal. 180 tablet 1   olmesartan -hydrochlorothiazide (BENICAR  HCT) 40-25 MG tablet Take 1 tablet by mouth daily. 90 tablet 1   Simethicone (GAS-X PO) Take 1 tablet by mouth daily as needed (gas).     vitamin B-12 (CYANOCOBALAMIN) 500 MCG tablet Take 500 mcg by mouth daily.     No facility-administered medications prior to visit.    Allergies  Allergen Reactions   Benadryl [Diphenhydramine]     SOB    ROS Review of Systems  Constitutional:  Negative for chills and  fever.  HENT:  Negative for congestion, sinus pressure, sinus pain and sore throat.   Eyes:  Positive for visual disturbance. Negative for pain and discharge.  Respiratory:  Negative for cough and shortness of breath.   Cardiovascular:  Positive for leg swelling. Negative for chest pain and palpitations.  Gastrointestinal:  Negative for abdominal pain, diarrhea, nausea and vomiting.  Endocrine: Negative for polydipsia and polyuria.  Genitourinary:  Negative for dysuria and hematuria.  Musculoskeletal:  Negative for neck  pain and neck stiffness.  Skin:  Negative for rash.  Neurological:  Negative for dizziness and weakness.  Psychiatric/Behavioral:  Negative for agitation and behavioral problems.       Objective:    Physical Exam Vitals reviewed.  Constitutional:      General: She is not in acute distress.    Appearance: She is not diaphoretic.  HENT:     Head: Normocephalic and atraumatic.     Nose: Nose normal.     Mouth/Throat:     Mouth: Mucous membranes are moist.  Eyes:     General: No scleral icterus.    Extraocular Movements: Extraocular movements intact.  Cardiovascular:     Rate and Rhythm: Normal rate and regular rhythm.     Heart sounds: Normal heart sounds. No murmur heard. Pulmonary:     Breath sounds: Normal breath sounds. No wheezing or rales.  Musculoskeletal:     Cervical back: Neck supple. No tenderness.     Right lower leg: No edema.     Left lower leg: No edema.  Feet:     Left foot:     Skin integrity: Dry skin present.  Skin:    General: Skin is warm.     Findings: No rash.     Comments: About 1.5 cm in diameter cystic skin lesion over medial side of right breast  Neurological:     General: No focal deficit present.     Mental Status: She is alert and oriented to person, place, and time.     Sensory: No sensory deficit.     Motor: No weakness.  Psychiatric:        Mood and Affect: Mood normal.        Behavior: Behavior normal.     BP  118/75   Pulse 77   Ht 5' 8 (1.727 m)   Wt 216 lb 9.6 oz (98.2 kg)   SpO2 92%   BMI 32.93 kg/m  Wt Readings from Last 3 Encounters:  08/09/23 216 lb 9.6 oz (98.2 kg)  08/06/23 222 lb 10.6 oz (101 kg)  07/26/23 222 lb 6.4 oz (100.9 kg)    Lab Results  Component Value Date   TSH 0.009 (L) 07/26/2023   Lab Results  Component Value Date   WBC 9.0 08/06/2023   HGB 14.7 08/06/2023   HCT 46.1 (H) 08/06/2023   MCV 82.3 08/06/2023   PLT 261 08/06/2023   Lab Results  Component Value Date   NA 137 08/06/2023   K 3.5 08/06/2023   CO2 24 08/06/2023   GLUCOSE 273 (H) 08/06/2023   BUN 9 08/06/2023   CREATININE 0.74 08/06/2023   BILITOT 0.3 07/26/2023   ALKPHOS 169 (H) 07/26/2023   AST 21 07/26/2023   ALT 33 (H) 07/26/2023   PROT 6.9 07/26/2023   ALBUMIN 4.0 07/26/2023   CALCIUM  9.2 08/06/2023   ANIONGAP 12 08/06/2023   EGFR 76 07/26/2023   Lab Results  Component Value Date   CHOL 127 07/26/2023   Lab Results  Component Value Date   HDL 41 07/26/2023   Lab Results  Component Value Date   LDLCALC 64 07/26/2023   Lab Results  Component Value Date   TRIG 126 07/26/2023   Lab Results  Component Value Date   CHOLHDL 3.1 07/26/2023   Lab Results  Component Value Date   HGBA1C 13.3 (H) 07/26/2023      Assessment & Plan:   Problem List Items Addressed This Visit  Cardiovascular and Mediastinum   Essential hypertension   BP Readings from Last 1 Encounters:  08/09/23 118/75   Well-controlled with olmesartan -hydrochlorothiazide 40-25 mg QD for now If persistently elevated BP, will add amlodipine  Counseled for compliance with the medications Advised DASH diet and moderate exercise/walking, at least 150 mins/week Needs to cut down soft drink intake      Relevant Orders   EKG 12-Lead (Completed)     Endocrine   Type 2 diabetes mellitus with other specified complication (HCC)   Lab Results  Component Value Date   HGBA1C 13.3 (H) 07/26/2023    Uncontrolled due to noncompliance to medications and diet Associated with HTN, OSA and obesity Recently restarted metformin  500 mg BID, but has not picked up glipizide  5 mg BID yet, needs to be compliant Tried to start GLP-1 agonist, but Ozempic  and Mounjaro  were not covered - had added Trulicity , but she lost follow up  Referred to Endocrinology Advised to follow diabetic diet -needs to cut down soft drink intake Once she is compliant to current regimen, will start statin - last lipid profile showed LDL less than 70, On [facility]B F/u CMP and lipid panel Diabetic eye exam: Advised to follow up with Ophthalmology for diabetic eye exam      Hyperthyroidism   Lab Results  Component Value Date   TSH 0.009 (L) 07/26/2023   With free T4 wnl Referred to Endocrinology        Other   Encounter for examination following treatment at hospital - Primary   ER visit for hypertensive urgency BP has improved currently with Benicar , needs to be compliant Advised to follow low-salt diet EKG in ER was read as atrial fibrillation, but likely had lead placement issue, not clear enough to distinguish P waves EKG in the office today showed sinus rhythm - she is asymptomatic      Blurry vision   Likely due to uncontrolled type II DM and HTN Advised to contact optometry offices in [place]       No orders of the defined types were placed in this encounter.   Follow-up: Return if symptoms worsen or fail to improve.    Suzzane MARLA Blanch, MD

## 2023-08-09 NOTE — Assessment & Plan Note (Signed)
 BP Readings from Last 1 Encounters:  08/09/23 118/75   Well-controlled with olmesartan -hydrochlorothiazide 40-25 mg QD for now If persistently elevated BP, will add amlodipine  Counseled for compliance with the medications Advised DASH diet and moderate exercise/walking, at least 150 mins/week Needs to cut down soft drink intake

## 2023-08-12 ENCOUNTER — Other Ambulatory Visit: Payer: Self-pay | Admitting: Internal Medicine

## 2023-08-12 ENCOUNTER — Telehealth: Payer: Self-pay

## 2023-08-12 DIAGNOSIS — J029 Acute pharyngitis, unspecified: Secondary | ICD-10-CM

## 2023-08-12 MED ORDER — AZITHROMYCIN 250 MG PO TABS: ORAL_TABLET | ORAL | 0 refills | Status: AC

## 2023-08-12 NOTE — Telephone Encounter (Signed)
 Copied from CRM #[id number]. Topic: Clinical - Medication Question >> Aug 12, 2023  9:14 AM Turkey B wrote: Reason for CRM: Patient called in states Dr Tobie was to be sending her an antibiotic from her appt on Tuesday, but the pharmacy doesn't have it. Patient doesn't have the name of it

## 2023-08-23 ENCOUNTER — Ambulatory Visit: Payer: Self-pay

## 2023-08-23 NOTE — Telephone Encounter (Signed)
 FYI Only or Action Required?: Action required by provider: update on patient condition.  Patient was last seen in primary care on 08/09/2023 by Tobie Suzzane POUR, MD.  Called Nurse Triage reporting Mass (/).  Symptoms began several days ago.  Interventions attempted: Ice/heat application.  Symptoms are: gradually worsening.  Triage Disposition: See Physician Within 24 Hours  Patient/caregiver understands and will follow disposition?: No, wishes to speak with PCP Patient says that her PCP is aware of the abscess and that she was told to notify him if/when it starts draining. Patient wants to hold off of scheduling OV for now, wants to see if PCP will treat the lump without a visit   Copied from CRM #[id number]. Topic: Clinical - Red Word Triage >> Aug 23, 2023  8:57 AM Jasmin G wrote: [facility] that prompted transfer to Nurse Triage: Pt states that last time she was seen at clinic, she mentioned that she had a bump on her arm to Dr, he advised that if bump started to show signs of infection she should call, pt states that bump started to secrete puss and blood yesterday. Reason for Disposition  [1] Boil AND [2] diabetes mellitus or weak immune system (e.g., HIV positive, cancer chemo, splenectomy, organ transplant, chronic steroids)  Answer Assessment - Initial Assessment Questions 1. APPEARANCE of BOIL: What does the boil look like?      Pus on the top and some bruising around it  2. LOCATION: Where is the boil located?      Right breast  3. NUMBER: How many boils are there?      1  4. SIZE: How big is the boil? (e.g., inches, cm; compare to size of a coin or other object)     The size of a nickel  5. ONSET: When did the boil start?     Unsure of onset  6. PAIN: Is there any pain? If Yes, ask: How bad is the pain?   (Scale 1-10; or mild, moderate, severe)     No pain  7. FEVER: Do you have a fever? If Yes, ask: What is it, how was it measured, and when did it  start?      No fever  8. SOURCE: Have you been around anyone with boils or other Staph infections? Have you ever had boils before?     Unsure  9. OTHER SYMPTOMS: Do you have any other symptoms? (e.g., shaking chills, weakness, rash elsewhere on body)     No  10. PREGNANCY: Is there any chance you are pregnant? When was your last menstrual period?       No  Protocols used: Boil (Skin Abscess)-A-AH

## 2023-09-27 ENCOUNTER — Ambulatory Visit: Admitting: Internal Medicine

## 2023-09-27 ENCOUNTER — Encounter: Payer: Self-pay | Admitting: Internal Medicine

## 2023-09-27 VITALS — BP 138/84 | HR 71 | Ht 68.0 in | Wt 223.6 lb

## 2023-09-27 DIAGNOSIS — I10 Essential (primary) hypertension: Secondary | ICD-10-CM | POA: Diagnosis not present

## 2023-09-27 DIAGNOSIS — E1169 Type 2 diabetes mellitus with other specified complication: Secondary | ICD-10-CM

## 2023-09-27 DIAGNOSIS — E0590 Thyrotoxicosis, unspecified without thyrotoxic crisis or storm: Secondary | ICD-10-CM | POA: Diagnosis not present

## 2023-09-27 DIAGNOSIS — E782 Mixed hyperlipidemia: Secondary | ICD-10-CM

## 2023-09-27 MED ORDER — GLUCOSE BLOOD VI STRP: ORAL_STRIP | 12 refills | Status: AC

## 2023-09-27 MED ORDER — AMLODIPINE BESYLATE 5 MG PO TABS: 5.0000 mg | ORAL_TABLET | Freq: Every day | ORAL | 1 refills | Status: DC

## 2023-09-27 NOTE — Progress Notes (Unsigned)
 Established Patient Office Visit  Subjective:  Patient ID: Colleen Rowe, female    DOB: 05/12/1960  Age: 63 y.o. MRN: [id number]  CC:  Chief Complaint  Patient presents with   Follow-up    HPI Colleen Rowe is a 63 y.o. female with past medical history of HTN, type II DM and morbid obesity who presents for f/u of her chronic medical conditions.  HTN: BP is elevated today. She has started taking olmesartan -HCTZ. Patient denies headache, dizziness, chest pain, dyspnea or palpitations.  Type II DM: Her HbA1c was elevated at 13.3 in 07/25. She had run out of her metformin  and glipizide  for about 6 months, but has recently started taking them.  She was placed on Trulicity , but she was not able to get it due to insurance coverage/cost concern. She reports that her blood glucose stays around 150-200 (?).  She also reports cutting down on soft drink intake. She had been taking at least 3 soft drinks in a day. She denies any polyuria or polydipsia currently. She also reports that she needs to improve her diet.   She has been using CPAP device regularly now.    Past Medical History:  Diagnosis Date   Allergies    Diabetes mellitus without complication (HCC)    Hypertension     Past Surgical History:  Procedure Laterality Date   CHOLECYSTECTOMY     COLONOSCOPY WITH PROPOFOL  N/A 10/21/2020   Procedure: COLONOSCOPY WITH PROPOFOL ;  Surgeon: Cindie Carlin POUR, DO;  Location: [facility];  Service: Endoscopy;  Laterality: N/A;  9:00 / ASA II   DENTAL SURGERY      Family History  Problem Relation Age of Onset   Breast cancer Mother    Diabetes Mother    Hypertension Mother    Breast cancer Sister    Colon cancer Neg Hx     Social History   Socioeconomic History   Marital status: Divorced    Spouse name: Not on file   Number of children: 2   Years of education: Not on file   Highest education level: Not on file  Occupational History   Not on file  Tobacco Use   Smoking  status: Never   Smokeless tobacco: Never  Vaping Use   Vaping status: Never Used  Substance and Sexual Activity   Alcohol use: Not Currently   Drug use: Never   Sexual activity: Not Currently    Birth control/protection: Post-menopausal  Other Topics Concern   Not on file  Social History Narrative   Not on file   Social Drivers of Health   Financial Resource Strain: Unknown (10/13/2020)   Overall Financial Resource Strain (CARDIA)    Difficulty of Paying Living Expenses: Patient declined  Food Insecurity: Unknown (10/13/2020)   Hunger Vital Sign    Worried About Running Out of Food in the Last Year: Patient declined    Ran Out of Food in the Last Year: Patient declined  Transportation Needs: No Transportation Needs (10/13/2020)   PRAPARE - Administrator, Civil Service (Medical): No    Lack of Transportation (Non-Medical): No  Physical Activity: Sufficiently Active (10/13/2020)   Exercise Vital Sign    Days of Exercise per Week: 4 days    Minutes of Exercise per Session: 100 min  Stress: No Stress Concern Present (10/13/2020)   [employer] of Occupational Health - Occupational Stress Questionnaire    Feeling of Stress : Not at all  Social Connections: Unknown (  10/13/2020)   Social Connection and Isolation Panel    Frequency of Communication with Friends and Family: Patient declined    Frequency of Social Gatherings with Friends and Family: Patient declined    Attends Religious Services: Patient declined    Database administrator or Organizations: No    Attends Banker Meetings: Never    Marital Status: Divorced  Catering manager Violence: Not At Risk (10/13/2020)   Humiliation, Afraid, Rape, and Kick questionnaire    Fear of Current or Ex-Partner: No    Emotionally Abused: No    Physically Abused: No    Sexually Abused: No    Outpatient Medications Prior to Visit  Medication Sig Dispense Refill   acetaminophen  (TYLENOL ) 500 MG tablet  Take 500-1,000 mg by mouth every 6 (six) hours as needed for moderate pain.     Blood Glucose Monitoring Suppl (ACCU-CHEK GUIDE ME) w/Device KIT DISPENSE BASED ON PATIENT AND INSURANCE PREFERENCE. USE UP TO FOUR TIMES DAILY AS DIRECTED. (FOR ICD-10 E10.9, E11.9). 1 kit 0   Calcium  Carbonate Antacid (ALKA-SELTZER HEARTBURN PO) Take 2 tablets by mouth daily as needed (heartburn).     Cholecalciferol (VITAMIN D3) 50 MCG (2000 UT) TABS Take 2,000 Units by mouth daily.     ferrous gluconate (FERGON) 240 (27 FE) MG tablet Take 480 mg by mouth daily.     fexofenadine (ALLEGRA) 180 MG tablet Take 180 mg by mouth daily as needed for allergies or rhinitis.     glipiZIDE  (GLUCOTROL ) 10 MG tablet Take 1 tablet (10 mg total) by mouth 2 (two) times daily before a meal. 60 tablet 2   meloxicam  (MOBIC ) 7.5 MG tablet Take 1 tablet (7.5 mg total) by mouth daily. 30 tablet 2   metFORMIN  (GLUCOPHAGE ) 500 MG tablet Take 1 tablet (500 mg total) by mouth 2 (two) times daily with a meal. 180 tablet 1   olmesartan -hydrochlorothiazide (BENICAR  HCT) 40-25 MG tablet Take 1 tablet by mouth daily. 90 tablet 1   Simethicone (GAS-X PO) Take 1 tablet by mouth daily as needed (gas).     vitamin B-12 (CYANOCOBALAMIN) 500 MCG tablet Take 500 mcg by mouth daily.     glucose blood test strip Check sugar 3 times a day 100 each 12   No facility-administered medications prior to visit.    Allergies  Allergen Reactions   Benadryl [Diphenhydramine]     SOB    ROS Review of Systems  Constitutional:  Negative for chills and fever.  HENT:  Negative for congestion, sinus pressure, sinus pain and sore throat.   Eyes:  Negative for pain and discharge.  Respiratory:  Negative for cough and shortness of breath.   Cardiovascular:  Negative for chest pain and palpitations.  Gastrointestinal:  Negative for abdominal pain, diarrhea, nausea and vomiting.  Endocrine: Negative for polydipsia and polyuria.  Genitourinary:  Negative for  dysuria and hematuria.  Musculoskeletal:  Negative for neck pain and neck stiffness.  Skin:  Negative for rash.  Neurological:  Negative for dizziness and weakness.  Psychiatric/Behavioral:  Negative for agitation and behavioral problems.       Objective:    Physical Exam Vitals reviewed.  Constitutional:      General: She is not in acute distress.    Appearance: She is not diaphoretic.  HENT:     Head: Normocephalic and atraumatic.     Nose: Nose normal.     Mouth/Throat:     Mouth: Mucous membranes are moist.  Eyes:  General: No scleral icterus.    Extraocular Movements: Extraocular movements intact.  Cardiovascular:     Rate and Rhythm: Normal rate and regular rhythm.     Heart sounds: Normal heart sounds. No murmur heard. Pulmonary:     Breath sounds: Normal breath sounds. No wheezing or rales.  Musculoskeletal:     Cervical back: Neck supple. No tenderness.     Right lower leg: No edema.     Left lower leg: No edema.  Feet:     Left foot:     Skin integrity: Dry skin present.  Skin:    General: Skin is warm.     Findings: No rash.     Comments: About 1.5 cm in diameter cystic skin lesion over medial side of right breast  Neurological:     General: No focal deficit present.     Mental Status: She is alert and oriented to person, place, and time.     Sensory: No sensory deficit.     Motor: No weakness.  Psychiatric:        Mood and Affect: Mood normal.        Behavior: Behavior normal.     BP 138/84 (BP Location: Left Arm)   Pulse 71   Ht 5' 8 (1.727 m)   Wt 223 lb 9.6 oz (101.4 kg)   SpO2 97%   BMI 34.00 kg/m  Wt Readings from Last 3 Encounters:  09/27/23 223 lb 9.6 oz (101.4 kg)  08/09/23 216 lb 9.6 oz (98.2 kg)  08/06/23 222 lb 10.6 oz (101 kg)    Lab Results  Component Value Date   TSH 0.009 (L) 07/26/2023   Lab Results  Component Value Date   WBC 9.0 08/06/2023   HGB 14.7 08/06/2023   HCT 46.1 (H) 08/06/2023   MCV 82.3 08/06/2023    PLT 261 08/06/2023   Lab Results  Component Value Date   NA 137 08/06/2023   K 3.5 08/06/2023   CO2 24 08/06/2023   GLUCOSE 273 (H) 08/06/2023   BUN 9 08/06/2023   CREATININE 0.74 08/06/2023   BILITOT 0.3 07/26/2023   ALKPHOS 169 (H) 07/26/2023   AST 21 07/26/2023   ALT 33 (H) 07/26/2023   PROT 6.9 07/26/2023   ALBUMIN 4.0 07/26/2023   CALCIUM  9.2 08/06/2023   ANIONGAP 12 08/06/2023   EGFR 76 07/26/2023   Lab Results  Component Value Date   CHOL 127 07/26/2023   Lab Results  Component Value Date   HDL 41 07/26/2023   Lab Results  Component Value Date   LDLCALC 64 07/26/2023   Lab Results  Component Value Date   TRIG 126 07/26/2023   Lab Results  Component Value Date   CHOLHDL 3.1 07/26/2023   Lab Results  Component Value Date   HGBA1C 13.3 (H) 07/26/2023      Assessment & Plan:   Problem List Items Addressed This Visit       Cardiovascular and Mediastinum   Essential hypertension - Primary   BP Readings from Last 1 Encounters:  09/27/23 138/84   Uncontrolled with olmesartan -hydrochlorothiazide 40-25 mg QD for now Due to persistently elevated BP, will add amlodipine  5 mg QD Counseled for compliance with the medications Advised DASH diet and moderate exercise/walking, at least 150 mins/week Needs to cut down soft drink intake      Relevant Medications   amLODipine  (NORVASC ) 5 MG tablet     Endocrine   Type 2 diabetes mellitus with other specified complication (  HCC)   Lab Results  Component Value Date   HGBA1C 13.3 (H) 07/26/2023   Was uncontrolled due to noncompliance to medications and diet, but improving since restarting metformin  and glipizide  Associated with HTN, OSA and obesity Recently restarted metformin  500 mg BID and glipizide  10 mg BID yet, needs to be compliant Tried to start GLP-1 agonist, but Ozempic  and Mounjaro  were not covered - had added Trulicity , but was very expensive  Referred to Endocrinology - awaiting appt Advised to  follow diabetic diet -needs to cut down soft drink intake Once she is compliant to current regimen, will start statin - last lipid profile showed LDL less than 70, On [facility]B F/u CMP and lipid panel Diabetic eye exam: Advised to follow up with Ophthalmology for diabetic eye exam      Relevant Medications   glucose blood test strip   Other Relevant Orders   CMP14+EGFR   Hemoglobin A1c   Hyperthyroidism   Lab Results  Component Value Date   TSH 0.009 (L) 07/26/2023   With free T4 wnl Referred to Endocrinology      Relevant Orders   TSH + free T4   Thyrotropin receptor autoabs     Other   Mixed hyperlipidemia   Last lipid profile showed LDL less than 70 Advised to follow low-carb, low cholesterol diet for now      Relevant Medications   amLODipine  (NORVASC ) 5 MG tablet    Meds ordered this encounter  Medications   amLODipine  (NORVASC ) 5 MG tablet    Sig: Take 1 tablet (5 mg total) by mouth daily.    Dispense:  90 tablet    Refill:  1   glucose blood test strip    Sig: Check sugar 3 times a day    Dispense:  100 each    Refill:  12    Okay to dispense based on insurance preference.    Follow-up: Return in about 6 weeks (around 11/08/2023) for HTN and DM.    Suzzane MARLA Blanch, MD

## 2023-09-27 NOTE — Assessment & Plan Note (Signed)
 Lab Results  Component Value Date   HGBA1C 13.3 (H) 07/26/2023   Uncontrolled due to noncompliance to medications and diet Associated with HTN, OSA and obesity Recently restarted metformin  500 mg BID, but has not picked up glipizide  5 mg BID yet, needs to be compliant Tried to start GLP-1 agonist, but Ozempic  and Mounjaro  were not covered - had added Trulicity , but she lost follow up  Referred to Endocrinology Advised to follow diabetic diet -needs to cut down soft drink intake Once she is compliant to current regimen, will start statin - last lipid profile showed LDL less than 70, On ARB F/u CMP and lipid panel Diabetic eye exam: Advised to follow up with Ophthalmology for diabetic eye exam

## 2023-09-27 NOTE — Assessment & Plan Note (Signed)
 BP Readings from Last 1 Encounters:  09/27/23 138/84   Well-controlled with olmesartan -hydrochlorothiazide 40-25 mg QD for now Due to persistently elevated BP, will add amlodipine  5 mg QD Counseled for compliance with the medications Advised DASH diet and moderate exercise/walking, at least 150 mins/week Needs to cut down soft drink intake

## 2023-09-27 NOTE — Patient Instructions (Addendum)
 Please start taking Amlodipine  for blood pressure in addition to Olmesartan -HCTZ.  Please continue to take other medications as prescribed.  Please continue to follow low carb diet and perform moderate exercise/walking at least 150 mins/week.  Please get fasting blood tests done before the next visit.

## 2023-09-27 NOTE — Assessment & Plan Note (Signed)
 Lab Results  Component Value Date   TSH 0.009 (L) 07/26/2023   With free T4 wnl Referred to Endocrinology

## 2023-09-28 NOTE — Assessment & Plan Note (Signed)
Last lipid profile showed LDL less than 70 Advised to follow low-carb, low cholesterol diet for now

## 2023-11-08 ENCOUNTER — Ambulatory Visit: Admitting: Internal Medicine

## 2023-11-28 ENCOUNTER — Other Ambulatory Visit: Payer: Self-pay | Admitting: Internal Medicine

## 2023-11-28 DIAGNOSIS — E119 Type 2 diabetes mellitus without complications: Secondary | ICD-10-CM

## 2023-11-28 MED ORDER — METFORMIN HCL 500 MG PO TABS
500.0000 mg | ORAL_TABLET | Freq: Two times a day (BID) | ORAL | 1 refills | Status: AC
Start: 2023-11-28 — End: ?

## 2023-11-28 NOTE — Telephone Encounter (Signed)
 Copied from CRM #[id number]. Topic: Clinical - Medication Refill >> Nov 28, 2023  8:46 AM Tiffany B wrote: Medication: Patient will run out of medication prior to her 12/12/2023   metFORMIN  (GLUCOPHAGE ) 500 MG tablet    Has the patient contacted their pharmacy? No   This is the patient's preferred pharmacy:  [id number] - [place], [place] - [address] AT [facility] [address] [place] [address] Phone: [phone] Fax: [phone]  Is this the correct pharmacy for this prescription? Yes If no, delete pharmacy and type the correct one.   Has the prescription been filled recently? Yes  Is the patient out of the medication? No   Has the patient been seen for an appointment in the last year OR does the patient have an upcoming appointment? Yes  Can we respond through MyChart? No  Agent: Please be advised that Rx refills may take up to 3 business days. We ask that you follow-up with your pharmacy.

## 2023-12-07 NOTE — Patient Instructions (Signed)

## 2023-12-08 ENCOUNTER — Telehealth: Payer: Self-pay

## 2023-12-08 ENCOUNTER — Encounter: Payer: Self-pay | Admitting: Nurse Practitioner

## 2023-12-08 DIAGNOSIS — E1165 Type 2 diabetes mellitus with hyperglycemia: Secondary | ICD-10-CM

## 2023-12-08 DIAGNOSIS — E782 Mixed hyperlipidemia: Secondary | ICD-10-CM

## 2023-12-08 DIAGNOSIS — I10 Essential (primary) hypertension: Secondary | ICD-10-CM

## 2023-12-08 DIAGNOSIS — R7989 Other specified abnormal findings of blood chemistry: Secondary | ICD-10-CM

## 2023-12-08 DIAGNOSIS — Z7984 Long term (current) use of oral hypoglycemic drugs: Secondary | ICD-10-CM

## 2023-12-08 NOTE — Telephone Encounter (Signed)
 Spoke to patient

## 2023-12-08 NOTE — Progress Notes (Signed)
 Erroneous encounter

## 2023-12-08 NOTE — Telephone Encounter (Signed)
 Copied from CRM #[id number]. Topic: Clinical - Medication Question >> Dec 08, 2023  9:47 AM Gustabo D wrote: Pt wants to know if she will be able to get her medication up even though her appt is in March

## 2023-12-12 ENCOUNTER — Ambulatory Visit: Admitting: Internal Medicine

## 2023-12-20 ENCOUNTER — Telehealth: Payer: Self-pay

## 2023-12-20 ENCOUNTER — Other Ambulatory Visit: Payer: Self-pay | Admitting: Internal Medicine

## 2023-12-20 DIAGNOSIS — M1711 Unilateral primary osteoarthritis, right knee: Secondary | ICD-10-CM

## 2023-12-20 DIAGNOSIS — E1169 Type 2 diabetes mellitus with other specified complication: Secondary | ICD-10-CM

## 2023-12-20 DIAGNOSIS — I10 Essential (primary) hypertension: Secondary | ICD-10-CM

## 2023-12-20 MED ORDER — OLMESARTAN MEDOXOMIL-HCTZ 40-25 MG PO TABS: 1.0000 | ORAL_TABLET | Freq: Every day | ORAL | 1 refills | Status: DC

## 2023-12-20 MED ORDER — MELOXICAM 7.5 MG PO TABS: 7.5000 mg | ORAL_TABLET | Freq: Every day | ORAL | 2 refills | Status: AC

## 2023-12-20 MED ORDER — AMLODIPINE BESYLATE 5 MG PO TABS: 5.0000 mg | ORAL_TABLET | Freq: Every day | ORAL | 1 refills | Status: DC

## 2023-12-20 MED ORDER — GLIPIZIDE 10 MG PO TABS: 10.0000 mg | ORAL_TABLET | Freq: Two times a day (BID) | ORAL | 2 refills | Status: AC

## 2023-12-20 NOTE — Telephone Encounter (Signed)
 Copied from CRM #[id number]. Topic: Clinical - Medical Advice >> Dec 20, 2023  9:00 AM Thersia BROCKS wrote: Reason for CRM: Patient called in regarding her eczema, wants to know what Dr.Patel recommend to use for it , would like a callback

## 2023-12-20 NOTE — Telephone Encounter (Signed)
 Left detailed vm for patient.

## 2023-12-20 NOTE — Telephone Encounter (Signed)
 Copied from CRM #[id number]. Topic: Clinical - Medication Refill >> Dec 20, 2023  8:59 AM Thersia C wrote: Medication: amLODipine  (NORVASC ) 5 MG tablet HCT) 40-25 MG tablet meloxicam  (MOBIC ) 7.5 MG tablet  glipiZIDE  (GLUCOTROL ) 10 MG tablet  olmesartan -hydrochlorothiazide (BENICAR  HCT) 40-25 MG tablet   Has the patient contacted their pharmacy? Yes (Agent: If no, request that the patient contact the pharmacy for the refill. If patient does not wish to contact the pharmacy document the reason why and proceed with request.) (Agent: If yes, when and what did the pharmacy advise?)  This is the patient's preferred pharmacy:  [id number] - [place], [place] - [address] AT [facility] [address] [place] [address] Phone: [phone] Fax: [phone]  Is this the correct pharmacy for this prescription? Yes If no, delete pharmacy and type the correct one.   Has the prescription been filled recently? No  Is the patient out of the medication? Yes  Has the patient been seen for an appointment in the last year OR does the patient have an upcoming appointment? Yes  Can we respond through MyChart? Yes  Agent: Please be advised that Rx refills may take up to 3 business days. We ask that you follow-up with your pharmacy.

## 2024-02-10 ENCOUNTER — Ambulatory Visit: Payer: Self-pay | Admitting: Family Medicine

## 2024-02-10 ENCOUNTER — Ambulatory Visit: Payer: Self-pay | Admitting: Internal Medicine

## 2024-02-10 DIAGNOSIS — I10 Essential (primary) hypertension: Secondary | ICD-10-CM

## 2024-02-10 MED ORDER — AMLODIPINE BESYLATE 5 MG PO TABS: 5.0000 mg | ORAL_TABLET | Freq: Every day | ORAL | 1 refills | Status: AC

## 2024-02-10 MED ORDER — OLMESARTAN MEDOXOMIL-HCTZ 40-25 MG PO TABS: 1.0000 | ORAL_TABLET | Freq: Every day | ORAL | 1 refills | Status: AC

## 2024-02-10 NOTE — Telephone Encounter (Signed)
" °  FYI Only or Action Required?: Action required by provider: medication refill request.  Patient was last seen in primary care on 09/27/2023 by Tobie Suzzane POUR, MD.  Called Nurse Triage reporting Knee Pain and Elbow Pain.  Symptoms began a week ago.  Interventions attempted: OTC medications: Tylenol , pain cream and lotion.  Symptoms are: gradually worsening.  Triage Disposition: See Physician Within 24 Hours  Patient/caregiver understands and will follow disposition?: Yes                 Message from Lonell PEDLAR sent at 02/10/2024  8:40 AM EST  Reason for Triage: knee pain in both knees and right elbow, unable to sleep at night   Reason for Disposition  Swollen joint  [1] MODERATE pain (e.g., interferes with normal activities, limping) AND [2] present > 3 days  Answer Assessment - Initial Assessment Questions 1. ONSET: When did the pain start?     X 1 week  2. LOCATION: Where is the pain located?      Right elbow   3. PAIN: How bad is the pain? (Scale 1-10; or mild, moderate, severe)     5/10  4. WORK OR EXERCISE: Has there been any recent work or exercise that involved this part of the body?     No.  5. CAUSE: What do you think is causing the elbow pain?     Unknown.  6. OTHER SYMPTOMS: Do you have any other symptoms? (e.g., neck pain, elbow swelling, rash, fever)     mild localized swelling of elbow. No redness, fever, rash, chest pain, hx of heart attack, difficulty breathing, unusual sweating, entire arm swollen, weakness of right hand/fingers  Answer Assessment - Initial Assessment Questions 1. LOCATION and RADIATION: Where is the pain located?      Bilateral, worse on the left side.  2. QUALITY: What does the pain feel like?  (e.g., sharp, dull, aching, burning)     Sharp, aching, dull.  3. SEVERITY: How bad is the pain? What does it keep you from doing?   (Scale 1-10; or mild, moderate, severe)     5/10, left was severe last  night.  4. ONSET: When did the pain start? Does it come and go, or is it there all the time?     X 1 week  5. RECURRENT: Have you had this pain before? If Yes, ask: When, and what happened then?     Last year was also seen for knee pain, inflammation in left knee. Was told it wasn't arthritis they thought I injured it without remembering it  6. SETTING: Has there been any recent work, exercise or other activity that involved that part of the body?      She walks a lot at work and has been wearing new shoes.  7. AGGRAVATING FACTORS: What makes the knee pain worse? (e.g., walking, climbing stairs, running)     Lying down to sleep at night.  8. ASSOCIATED SYMPTOMS: Is there any swelling or redness of the knee?     Swelling in both, mild in right and moderate in left  9. OTHER SYMPTOMS: Do you have any other symptoms? (e.g., calf pain, chest pain, difficulty breathing, fever)     See elbow triage.  Protocols used: Elbow Pain-A-AH, Knee Pain-A-AH  "

## 2024-02-10 NOTE — Telephone Encounter (Signed)
 noted

## 2024-02-14 ENCOUNTER — Ambulatory Visit: Payer: Self-pay | Admitting: Internal Medicine

## 2024-03-06 ENCOUNTER — Ambulatory Visit: Payer: Self-pay | Admitting: Nurse Practitioner

## 2024-03-08 ENCOUNTER — Ambulatory Visit: Payer: Self-pay | Admitting: Dermatology

## 2024-03-12 ENCOUNTER — Ambulatory Visit: Admitting: Internal Medicine
